# Patient Record
Sex: Male | Born: 1945
Health system: Southern US, Community
[De-identification: ages and names within clinical notes are randomized; demographics above are authoritative.]

## PROBLEM LIST (undated history)

## (undated) DIAGNOSIS — N390 Urinary tract infection, site not specified: Secondary | ICD-10-CM

## (undated) DIAGNOSIS — E785 Hyperlipidemia, unspecified: Secondary | ICD-10-CM

## (undated) DIAGNOSIS — F419 Anxiety disorder, unspecified: Secondary | ICD-10-CM

## (undated) DIAGNOSIS — Z86010 Personal history of colonic polyps: Secondary | ICD-10-CM

## (undated) DIAGNOSIS — Z978 Presence of other specified devices: Secondary | ICD-10-CM

## (undated) DIAGNOSIS — C801 Malignant (primary) neoplasm, unspecified: Secondary | ICD-10-CM

## (undated) DIAGNOSIS — I10 Essential (primary) hypertension: Secondary | ICD-10-CM

## (undated) HISTORY — DX: Hyperlipidemia, unspecified: E78.5

## (undated) HISTORY — DX: Essential (primary) hypertension: I10

## (undated) HISTORY — DX: Personal history of colonic polyps: Z86.010

## (undated) HISTORY — PX: COLONOSCOPY: SHX174

## (undated) HISTORY — PX: EYE SURGERY: SHX253

## (undated) HISTORY — PX: HERNIA REPAIR: SHX51

---

## 2011-08-07 DIAGNOSIS — H52209 Unspecified astigmatism, unspecified eye: Secondary | ICD-10-CM | POA: Diagnosis not present

## 2011-08-07 DIAGNOSIS — H2510 Age-related nuclear cataract, unspecified eye: Secondary | ICD-10-CM | POA: Diagnosis not present

## 2011-08-10 DIAGNOSIS — I10 Essential (primary) hypertension: Secondary | ICD-10-CM | POA: Diagnosis not present

## 2011-08-10 DIAGNOSIS — R7301 Impaired fasting glucose: Secondary | ICD-10-CM | POA: Diagnosis not present

## 2011-08-10 DIAGNOSIS — E785 Hyperlipidemia, unspecified: Secondary | ICD-10-CM | POA: Diagnosis not present

## 2011-11-30 DIAGNOSIS — Z23 Encounter for immunization: Secondary | ICD-10-CM | POA: Diagnosis not present

## 2012-03-08 DIAGNOSIS — R7301 Impaired fasting glucose: Secondary | ICD-10-CM | POA: Diagnosis not present

## 2012-03-08 DIAGNOSIS — E559 Vitamin D deficiency, unspecified: Secondary | ICD-10-CM | POA: Diagnosis not present

## 2012-03-08 DIAGNOSIS — Z125 Encounter for screening for malignant neoplasm of prostate: Secondary | ICD-10-CM | POA: Diagnosis not present

## 2012-03-08 DIAGNOSIS — E785 Hyperlipidemia, unspecified: Secondary | ICD-10-CM | POA: Diagnosis not present

## 2012-03-08 DIAGNOSIS — I10 Essential (primary) hypertension: Secondary | ICD-10-CM | POA: Diagnosis not present

## 2012-03-15 DIAGNOSIS — Z1331 Encounter for screening for depression: Secondary | ICD-10-CM | POA: Diagnosis not present

## 2012-03-15 DIAGNOSIS — Z0000 Encounter for general adult medical examination without abnormal findings: Secondary | ICD-10-CM | POA: Diagnosis not present

## 2012-03-15 DIAGNOSIS — Z125 Encounter for screening for malignant neoplasm of prostate: Secondary | ICD-10-CM | POA: Diagnosis not present

## 2012-03-15 DIAGNOSIS — I10 Essential (primary) hypertension: Secondary | ICD-10-CM | POA: Diagnosis not present

## 2012-03-15 DIAGNOSIS — E785 Hyperlipidemia, unspecified: Secondary | ICD-10-CM | POA: Diagnosis not present

## 2012-03-20 DIAGNOSIS — L821 Other seborrheic keratosis: Secondary | ICD-10-CM | POA: Diagnosis not present

## 2012-03-20 DIAGNOSIS — L723 Sebaceous cyst: Secondary | ICD-10-CM | POA: Diagnosis not present

## 2012-03-20 DIAGNOSIS — D2330 Other benign neoplasm of skin of unspecified part of face: Secondary | ICD-10-CM | POA: Diagnosis not present

## 2012-03-20 DIAGNOSIS — D1801 Hemangioma of skin and subcutaneous tissue: Secondary | ICD-10-CM | POA: Diagnosis not present

## 2012-03-20 DIAGNOSIS — D485 Neoplasm of uncertain behavior of skin: Secondary | ICD-10-CM | POA: Diagnosis not present

## 2012-04-01 DIAGNOSIS — R972 Elevated prostate specific antigen [PSA]: Secondary | ICD-10-CM | POA: Diagnosis not present

## 2012-04-09 DIAGNOSIS — R972 Elevated prostate specific antigen [PSA]: Secondary | ICD-10-CM | POA: Diagnosis not present

## 2012-08-22 ENCOUNTER — Telehealth: Payer: Self-pay

## 2012-08-22 DIAGNOSIS — Z1211 Encounter for screening for malignant neoplasm of colon: Secondary | ICD-10-CM

## 2012-08-22 MED ORDER — MOVIPREP 100 G PO SOLR: 1.0000 | Freq: Once | ORAL | Status: DC

## 2012-08-22 NOTE — Telephone Encounter (Signed)
Kerigan Narvaez, My dad's last colon[place] was in [place] and was 12 years ago, no polyps found. He needs repeat routine screening colonoscopy, he was hoping for sometime in August, I 'm sending him to Dr. Leone Payor who already knows about it. His cell number is [phone]. Name Charles Galloway. Thanks   Pt has been scheduled paperwork left at my desk for Dr Christella Hartigan to pick up and have signed

## 2012-10-21 HISTORY — PX: TOOTH EXTRACTION: SUR596

## 2012-10-30 ENCOUNTER — Encounter: Payer: Self-pay | Admitting: Internal Medicine

## 2012-10-30 ENCOUNTER — Ambulatory Visit (AMBULATORY_SURGERY_CENTER): Payer: Medicare Other | Admitting: Internal Medicine

## 2012-10-30 VITALS — BP 135/81 | HR 51 | Temp 97.4°F | Resp 16 | Ht 69.0 in | Wt 180.0 lb

## 2012-10-30 DIAGNOSIS — Z1211 Encounter for screening for malignant neoplasm of colon: Secondary | ICD-10-CM | POA: Diagnosis not present

## 2012-10-30 DIAGNOSIS — D126 Benign neoplasm of colon, unspecified: Secondary | ICD-10-CM

## 2012-10-30 DIAGNOSIS — K5730 Diverticulosis of large intestine without perforation or abscess without bleeding: Secondary | ICD-10-CM

## 2012-10-30 MED ORDER — SODIUM CHLORIDE 0.9 % IV SOLN: 500.0000 mL | INTRAVENOUS | Status: DC

## 2012-10-30 NOTE — Patient Instructions (Addendum)
I found and removed two small polyps that look benign. You also have diverticulosis of the colon - thickened muscles and pockets that are common but not usually a problem.  I will let you know pathology results and when to have another routine colonoscopy by mail.  I appreciate the opportunity to care for you. Iva Boop, MD, FACG   YOU HAD AN ENDOSCOPIC PROCEDURE TODAY AT THE [facility]: Refer to the procedure report that was given to you for any specific questions about what was found during the examination.  If the procedure report does not answer your questions, please call your gastroenterologist to clarify.  If you requested that your care partner not be given the details of your procedure findings, then the procedure report has been included in a sealed envelope for you to review at your convenience later.  YOU SHOULD EXPECT: Some feelings of bloating in the abdomen. Passage of more gas than usual.  Walking can help get rid of the air that was put into your GI tract during the procedure and reduce the bloating. If you had a lower endoscopy (such as a colonoscopy or flexible sigmoidoscopy) you may notice spotting of blood in your stool or on the toilet paper. If you underwent a bowel prep for your procedure, then you may not have a normal bowel movement for a few days.  DIET: Your first meal following the procedure should be a light meal and then it is ok to progress to your normal diet.  A half-sandwich or bowl of soup is an example of a good first meal.  Heavy or fried foods are harder to digest and may make you feel nauseous or bloated.  Likewise meals heavy in dairy and vegetables can cause extra gas to form and this can also increase the bloating.  Drink plenty of fluids but you should avoid alcoholic beverages for 24 hours.  ACTIVITY: Your care partner should take you home directly after the procedure.  You should plan to take it easy, moving slowly for the rest of the  day.  You can resume normal activity the day after the procedure however you should NOT DRIVE or use heavy machinery for 24 hours (because of the sedation medicines used during the test).    SYMPTOMS TO REPORT IMMEDIATELY: A gastroenterologist can be reached at any hour.  During normal business hours, 8:30 AM to 5:00 PM Monday through Friday, call [phone].  After hours and on weekends, please call the GI answering service at [phone] who will take a message and have the physician on call contact you.   Following lower endoscopy (colonoscopy or flexible sigmoidoscopy):  Excessive amounts of blood in the stool  Significant tenderness or worsening of abdominal pains  Swelling of the abdomen that is new, acute  Fever of 100F or higher  FOLLOW UP: If any biopsies were taken you will be contacted by phone or by letter within the next 1-3 weeks.  Call your gastroenterologist if you have not heard about the biopsies in 3 weeks.  Our staff will call the home number listed on your records the next business day following your procedure to check on you and address any questions or concerns that you may have at that time regarding the information given to you following your procedure. This is a courtesy call and so if there is no answer at the home number and we have not heard from you through the emergency physician on call, we  will assume that you have returned to your regular daily activities without incident.  SIGNATURES/CONFIDENTIALITY: You and/or your care partner have signed paperwork which will be entered into your electronic medical record.  These signatures attest to the fact that that the information above on your After Visit Summary has been reviewed and is understood.  Full responsibility of the confidentiality of this discharge information lies with you and/or your care-partner.

## 2012-10-30 NOTE — Op Note (Signed)
[facility] [address] [place], [address]   COLONOSCOPY PROCEDURE REPORT  PATIENT: Charles Galloway, Charles Galloway  MR#: [id number] BIRTHDATE: 09-28-1945 , 67  yrs. old GENDER: Male ENDOSCOPIST: Iva Boop, MD, [facility] PROCEDURE DATE:  10/30/2012 PROCEDURE:   Colonoscopy with snare polypectomy First Screening Colonoscopy - Avg.  risk and is 50 yrs.  old or older - No.  Prior Negative Screening - Now for repeat screening. 10 or more years since last screening  History of Adenoma - Now for follow-up colonoscopy & has been > or = to 3 yrs.  N/A  Polyps Removed Today? Yes. ASA CLASS:   Class II INDICATIONS:average risk screening and Last colonoscopy performed 10 years ago. MEDICATIONS: propofol (Diprivan) 250mg  IV, MAC sedation, administered by CRNA, and These medications were titrated to patient response per physician's verbal order  DESCRIPTION OF PROCEDURE:   After the risks benefits and alternatives of the procedure were thoroughly explained, informed consent was obtained.  A digital rectal exam revealed no abnormalities of the rectum, A digital rectal exam revealed no prostatic nodules, and A digital rectal exam revealed the prostate was not enlarged.   The [id number]  endoscope was introduced through the anus and advanced to the cecum, which was identified by both the appendix and ileocecal valve. No adverse events experienced.   The quality of the prep was good, using MoviPrep  The instrument was then slowly withdrawn as the colon was fully examined.   COLON FINDINGS: A flat polyp measuring 7 mm in size was found at the cecum.  A polypectomy was performed with a cold snare.  The resection was complete and the polyp tissue was completely retrieved.   A sessile polyp measuring 4 mm in size was found in the rectum.  A polypectomy was performed with a cold snare.  The resection was complete and the polyp tissue was completely retrieved.   Moderate  diverticulosis was noted in the sigmoid colon.   The colon mucosa was otherwise normal.   A right colon retroflexion was performed.  Retroflexed views revealed no abnormalities. The time to cecum=1 minutes 21 seconds.  Withdrawal time=13 minutes 0 seconds.  The scope was withdrawn and the procedure completed. COMPLICATIONS: There were no complications.  ENDOSCOPIC IMPRESSION: 1.   Flat polyp measuring 7 mm in size was found at the cecum; polypectomy was performed with a cold snare 2.   Sessile polyp measuring 4 mm in size was found in the rectum; polypectomy was performed with a cold snare 3.   Moderate diverticulosis was noted in the sigmoid colon 4.   The colon mucosa was otherwise normal - good prep second colonoscopy  RECOMMENDATIONS: Timing of repeat colonoscopy will be determined by pathology findings.  eSigned:  Iva Boop, MD, [facility] 10/30/2012 12:15 PM cc: Kari Baars, MD and The Patient

## 2012-10-30 NOTE — Progress Notes (Signed)
Patient did not experience any of the following events: a burn prior to discharge; a fall within the facility; wrong site/side/patient/procedure/implant event; or a hospital transfer or hospital admission upon discharge from the facility. (G8907) Patient did not have preoperative order for IV antibiotic SSI prophylaxis. (G8918)  

## 2012-10-30 NOTE — Progress Notes (Signed)
Lidocaine-40mg IV prior to Propofol InductionPropofol given over incremental dosages 

## 2012-10-30 NOTE — Progress Notes (Signed)
Called to room to assist during endoscopic procedure.  Patient ID and intended procedure confirmed with present staff. Received instructions for my participation in the procedure from the performing physician.  

## 2012-10-31 ENCOUNTER — Telehealth: Payer: Self-pay

## 2012-10-31 NOTE — Telephone Encounter (Signed)
  Follow up Call-  Call back number 10/30/2012  Post procedure Call Back phone  # [phone]  Permission to leave phone message Yes     Patient questions:  Do you have a fever, pain , or abdominal swelling? no Pain Score  0 *  Have you tolerated food without any problems? yes  Have you been able to return to your normal activities? yes  Do you have any questions about your discharge instructions: Diet   no Medications  no Follow up visit  no  Do you have questions or concerns about your Care? no  Actions: * If pain score is 4 or above: No action needed, pain <4.

## 2012-11-05 ENCOUNTER — Encounter: Payer: Self-pay | Admitting: Internal Medicine

## 2012-11-05 DIAGNOSIS — Z860100 Personal history of colon polyps, unspecified: Secondary | ICD-10-CM

## 2012-11-05 HISTORY — DX: Personal history of colonic polyps: Z86.010

## 2012-11-05 HISTORY — DX: Personal history of colon polyps, unspecified: Z86.0100

## 2012-11-05 NOTE — Progress Notes (Signed)
Quick Note:  7 mm sessile serrated polyp and a 4 mm hyperplastic polyp Repeat colonoscopy 2019 ______

## 2012-11-07 DIAGNOSIS — Z23 Encounter for immunization: Secondary | ICD-10-CM | POA: Diagnosis not present

## 2013-02-10 DIAGNOSIS — H52209 Unspecified astigmatism, unspecified eye: Secondary | ICD-10-CM | POA: Diagnosis not present

## 2013-02-10 DIAGNOSIS — H2510 Age-related nuclear cataract, unspecified eye: Secondary | ICD-10-CM | POA: Diagnosis not present

## 2013-03-14 DIAGNOSIS — Z125 Encounter for screening for malignant neoplasm of prostate: Secondary | ICD-10-CM | POA: Diagnosis not present

## 2013-03-14 DIAGNOSIS — E559 Vitamin D deficiency, unspecified: Secondary | ICD-10-CM | POA: Diagnosis not present

## 2013-03-14 DIAGNOSIS — R7301 Impaired fasting glucose: Secondary | ICD-10-CM | POA: Diagnosis not present

## 2013-03-14 DIAGNOSIS — E785 Hyperlipidemia, unspecified: Secondary | ICD-10-CM | POA: Diagnosis not present

## 2013-03-14 DIAGNOSIS — I10 Essential (primary) hypertension: Secondary | ICD-10-CM | POA: Diagnosis not present

## 2013-03-21 DIAGNOSIS — I10 Essential (primary) hypertension: Secondary | ICD-10-CM | POA: Diagnosis not present

## 2013-03-21 DIAGNOSIS — Z87891 Personal history of nicotine dependence: Secondary | ICD-10-CM | POA: Diagnosis not present

## 2013-03-21 DIAGNOSIS — F3289 Other specified depressive episodes: Secondary | ICD-10-CM | POA: Diagnosis not present

## 2013-03-21 DIAGNOSIS — E785 Hyperlipidemia, unspecified: Secondary | ICD-10-CM | POA: Diagnosis not present

## 2013-03-21 DIAGNOSIS — Z0000 Encounter for general adult medical examination without abnormal findings: Secondary | ICD-10-CM | POA: Diagnosis not present

## 2013-03-21 DIAGNOSIS — Z23 Encounter for immunization: Secondary | ICD-10-CM | POA: Diagnosis not present

## 2013-03-21 DIAGNOSIS — Z1331 Encounter for screening for depression: Secondary | ICD-10-CM | POA: Diagnosis not present

## 2013-03-21 DIAGNOSIS — R7301 Impaired fasting glucose: Secondary | ICD-10-CM | POA: Diagnosis not present

## 2013-03-21 DIAGNOSIS — Z125 Encounter for screening for malignant neoplasm of prostate: Secondary | ICD-10-CM | POA: Diagnosis not present

## 2013-03-21 DIAGNOSIS — E559 Vitamin D deficiency, unspecified: Secondary | ICD-10-CM | POA: Diagnosis not present

## 2013-10-16 DIAGNOSIS — R972 Elevated prostate specific antigen [PSA]: Secondary | ICD-10-CM | POA: Diagnosis not present

## 2013-10-16 DIAGNOSIS — Z125 Encounter for screening for malignant neoplasm of prostate: Secondary | ICD-10-CM | POA: Diagnosis not present

## 2013-10-30 DIAGNOSIS — Z23 Encounter for immunization: Secondary | ICD-10-CM | POA: Diagnosis not present

## 2014-03-19 DIAGNOSIS — E559 Vitamin D deficiency, unspecified: Secondary | ICD-10-CM | POA: Diagnosis not present

## 2014-03-19 DIAGNOSIS — R7301 Impaired fasting glucose: Secondary | ICD-10-CM | POA: Diagnosis not present

## 2014-03-19 DIAGNOSIS — Z125 Encounter for screening for malignant neoplasm of prostate: Secondary | ICD-10-CM | POA: Diagnosis not present

## 2014-03-19 DIAGNOSIS — E785 Hyperlipidemia, unspecified: Secondary | ICD-10-CM | POA: Diagnosis not present

## 2014-03-19 DIAGNOSIS — I10 Essential (primary) hypertension: Secondary | ICD-10-CM | POA: Diagnosis not present

## 2014-03-19 DIAGNOSIS — Z0000 Encounter for general adult medical examination without abnormal findings: Secondary | ICD-10-CM | POA: Diagnosis not present

## 2014-03-26 DIAGNOSIS — Z6826 Body mass index (BMI) 26.0-26.9, adult: Secondary | ICD-10-CM | POA: Diagnosis not present

## 2014-03-26 DIAGNOSIS — I10 Essential (primary) hypertension: Secondary | ICD-10-CM | POA: Diagnosis not present

## 2014-03-26 DIAGNOSIS — F329 Major depressive disorder, single episode, unspecified: Secondary | ICD-10-CM | POA: Diagnosis not present

## 2014-03-26 DIAGNOSIS — Z0000 Encounter for general adult medical examination without abnormal findings: Secondary | ICD-10-CM | POA: Diagnosis not present

## 2014-03-26 DIAGNOSIS — E559 Vitamin D deficiency, unspecified: Secondary | ICD-10-CM | POA: Diagnosis not present

## 2014-03-26 DIAGNOSIS — Z1389 Encounter for screening for other disorder: Secondary | ICD-10-CM | POA: Diagnosis not present

## 2014-03-26 DIAGNOSIS — E785 Hyperlipidemia, unspecified: Secondary | ICD-10-CM | POA: Diagnosis not present

## 2014-03-26 DIAGNOSIS — R972 Elevated prostate specific antigen [PSA]: Secondary | ICD-10-CM | POA: Diagnosis not present

## 2014-03-26 DIAGNOSIS — R7301 Impaired fasting glucose: Secondary | ICD-10-CM | POA: Diagnosis not present

## 2014-03-26 DIAGNOSIS — Z125 Encounter for screening for malignant neoplasm of prostate: Secondary | ICD-10-CM | POA: Diagnosis not present

## 2014-10-08 DIAGNOSIS — M79602 Pain in left arm: Secondary | ICD-10-CM | POA: Diagnosis not present

## 2014-10-08 DIAGNOSIS — Z6825 Body mass index (BMI) 25.0-25.9, adult: Secondary | ICD-10-CM | POA: Diagnosis not present

## 2014-10-08 DIAGNOSIS — I10 Essential (primary) hypertension: Secondary | ICD-10-CM | POA: Diagnosis not present

## 2014-10-08 DIAGNOSIS — E785 Hyperlipidemia, unspecified: Secondary | ICD-10-CM | POA: Diagnosis not present

## 2014-10-20 ENCOUNTER — Ambulatory Visit (INDEPENDENT_AMBULATORY_CARE_PROVIDER_SITE_OTHER): Payer: Medicare Other | Admitting: Psychology

## 2014-10-20 DIAGNOSIS — F411 Generalized anxiety disorder: Secondary | ICD-10-CM

## 2014-10-24 DIAGNOSIS — N410 Acute prostatitis: Secondary | ICD-10-CM | POA: Diagnosis not present

## 2014-10-25 DIAGNOSIS — N410 Acute prostatitis: Secondary | ICD-10-CM | POA: Diagnosis not present

## 2014-10-30 ENCOUNTER — Ambulatory Visit (INDEPENDENT_AMBULATORY_CARE_PROVIDER_SITE_OTHER): Payer: Medicare Other | Admitting: Psychology

## 2014-10-30 DIAGNOSIS — F411 Generalized anxiety disorder: Secondary | ICD-10-CM | POA: Diagnosis not present

## 2014-11-02 DIAGNOSIS — H2513 Age-related nuclear cataract, bilateral: Secondary | ICD-10-CM | POA: Diagnosis not present

## 2014-11-02 DIAGNOSIS — R3915 Urgency of urination: Secondary | ICD-10-CM | POA: Diagnosis not present

## 2014-11-02 DIAGNOSIS — H52203 Unspecified astigmatism, bilateral: Secondary | ICD-10-CM | POA: Diagnosis not present

## 2014-11-02 DIAGNOSIS — R972 Elevated prostate specific antigen [PSA]: Secondary | ICD-10-CM | POA: Diagnosis not present

## 2014-11-02 DIAGNOSIS — N400 Enlarged prostate without lower urinary tract symptoms: Secondary | ICD-10-CM | POA: Diagnosis not present

## 2014-11-16 ENCOUNTER — Ambulatory Visit (INDEPENDENT_AMBULATORY_CARE_PROVIDER_SITE_OTHER): Payer: Medicare Other | Admitting: Psychology

## 2014-11-16 DIAGNOSIS — F411 Generalized anxiety disorder: Secondary | ICD-10-CM | POA: Diagnosis not present

## 2014-11-25 ENCOUNTER — Ambulatory Visit (INDEPENDENT_AMBULATORY_CARE_PROVIDER_SITE_OTHER): Payer: Medicare Other | Admitting: Psychology

## 2014-11-25 DIAGNOSIS — F411 Generalized anxiety disorder: Secondary | ICD-10-CM

## 2014-12-05 DIAGNOSIS — Z23 Encounter for immunization: Secondary | ICD-10-CM | POA: Diagnosis not present

## 2014-12-16 ENCOUNTER — Ambulatory Visit (INDEPENDENT_AMBULATORY_CARE_PROVIDER_SITE_OTHER): Payer: Medicare Other | Admitting: Psychology

## 2014-12-16 DIAGNOSIS — F411 Generalized anxiety disorder: Secondary | ICD-10-CM

## 2015-03-11 DIAGNOSIS — M25812 Other specified joint disorders, left shoulder: Secondary | ICD-10-CM | POA: Diagnosis not present

## 2015-03-11 DIAGNOSIS — H2512 Age-related nuclear cataract, left eye: Secondary | ICD-10-CM | POA: Diagnosis not present

## 2015-03-11 DIAGNOSIS — H52202 Unspecified astigmatism, left eye: Secondary | ICD-10-CM | POA: Diagnosis not present

## 2015-04-07 DIAGNOSIS — R7301 Impaired fasting glucose: Secondary | ICD-10-CM | POA: Diagnosis not present

## 2015-04-07 DIAGNOSIS — I10 Essential (primary) hypertension: Secondary | ICD-10-CM | POA: Diagnosis not present

## 2015-04-07 DIAGNOSIS — E784 Other hyperlipidemia: Secondary | ICD-10-CM | POA: Diagnosis not present

## 2015-04-07 DIAGNOSIS — E559 Vitamin D deficiency, unspecified: Secondary | ICD-10-CM | POA: Diagnosis not present

## 2015-04-07 DIAGNOSIS — Z125 Encounter for screening for malignant neoplasm of prostate: Secondary | ICD-10-CM | POA: Diagnosis not present

## 2015-04-13 DIAGNOSIS — H35372 Puckering of macula, left eye: Secondary | ICD-10-CM | POA: Diagnosis not present

## 2015-04-13 DIAGNOSIS — H43812 Vitreous degeneration, left eye: Secondary | ICD-10-CM | POA: Diagnosis not present

## 2015-04-14 DIAGNOSIS — I10 Essential (primary) hypertension: Secondary | ICD-10-CM | POA: Diagnosis not present

## 2015-04-14 DIAGNOSIS — R7301 Impaired fasting glucose: Secondary | ICD-10-CM | POA: Diagnosis not present

## 2015-04-14 DIAGNOSIS — N400 Enlarged prostate without lower urinary tract symptoms: Secondary | ICD-10-CM | POA: Diagnosis not present

## 2015-04-14 DIAGNOSIS — F329 Major depressive disorder, single episode, unspecified: Secondary | ICD-10-CM | POA: Diagnosis not present

## 2015-04-14 DIAGNOSIS — Z0000 Encounter for general adult medical examination without abnormal findings: Secondary | ICD-10-CM | POA: Diagnosis not present

## 2015-04-14 DIAGNOSIS — Z1389 Encounter for screening for other disorder: Secondary | ICD-10-CM | POA: Diagnosis not present

## 2015-04-14 DIAGNOSIS — R972 Elevated prostate specific antigen [PSA]: Secondary | ICD-10-CM | POA: Diagnosis not present

## 2015-04-14 DIAGNOSIS — E559 Vitamin D deficiency, unspecified: Secondary | ICD-10-CM | POA: Diagnosis not present

## 2015-04-14 DIAGNOSIS — R740 Nonspecific elevation of levels of transaminase and lactic acid dehydrogenase [LDH]: Secondary | ICD-10-CM | POA: Diagnosis not present

## 2015-04-14 DIAGNOSIS — E784 Other hyperlipidemia: Secondary | ICD-10-CM | POA: Diagnosis not present

## 2015-04-14 DIAGNOSIS — Z6827 Body mass index (BMI) 27.0-27.9, adult: Secondary | ICD-10-CM | POA: Diagnosis not present

## 2015-06-15 DIAGNOSIS — H43812 Vitreous degeneration, left eye: Secondary | ICD-10-CM | POA: Diagnosis not present

## 2015-06-15 DIAGNOSIS — H35413 Lattice degeneration of retina, bilateral: Secondary | ICD-10-CM | POA: Diagnosis not present

## 2015-06-15 DIAGNOSIS — H35372 Puckering of macula, left eye: Secondary | ICD-10-CM | POA: Diagnosis not present

## 2015-06-15 DIAGNOSIS — H3581 Retinal edema: Secondary | ICD-10-CM | POA: Diagnosis not present

## 2015-07-26 DIAGNOSIS — H35372 Puckering of macula, left eye: Secondary | ICD-10-CM | POA: Diagnosis not present

## 2015-07-26 DIAGNOSIS — H3581 Retinal edema: Secondary | ICD-10-CM | POA: Diagnosis not present

## 2015-07-26 DIAGNOSIS — H43812 Vitreous degeneration, left eye: Secondary | ICD-10-CM | POA: Diagnosis not present

## 2015-07-26 DIAGNOSIS — H35413 Lattice degeneration of retina, bilateral: Secondary | ICD-10-CM | POA: Diagnosis not present

## 2015-08-26 DIAGNOSIS — H35412 Lattice degeneration of retina, left eye: Secondary | ICD-10-CM | POA: Diagnosis not present

## 2015-08-26 DIAGNOSIS — H3581 Retinal edema: Secondary | ICD-10-CM | POA: Diagnosis not present

## 2015-08-26 DIAGNOSIS — H35372 Puckering of macula, left eye: Secondary | ICD-10-CM | POA: Diagnosis not present

## 2015-09-03 DIAGNOSIS — H3581 Retinal edema: Secondary | ICD-10-CM | POA: Diagnosis not present

## 2015-09-03 DIAGNOSIS — Z48810 Encounter for surgical aftercare following surgery on the sense organs: Secondary | ICD-10-CM | POA: Diagnosis not present

## 2015-09-03 DIAGNOSIS — H35372 Puckering of macula, left eye: Secondary | ICD-10-CM | POA: Diagnosis not present

## 2015-09-22 DIAGNOSIS — H35372 Puckering of macula, left eye: Secondary | ICD-10-CM | POA: Diagnosis not present

## 2015-09-22 DIAGNOSIS — H3581 Retinal edema: Secondary | ICD-10-CM | POA: Diagnosis not present

## 2015-11-12 DIAGNOSIS — Z23 Encounter for immunization: Secondary | ICD-10-CM | POA: Diagnosis not present

## 2015-11-17 DIAGNOSIS — H3581 Retinal edema: Secondary | ICD-10-CM | POA: Diagnosis not present

## 2015-11-17 DIAGNOSIS — H35372 Puckering of macula, left eye: Secondary | ICD-10-CM | POA: Diagnosis not present

## 2016-02-23 DIAGNOSIS — H43811 Vitreous degeneration, right eye: Secondary | ICD-10-CM | POA: Diagnosis not present

## 2016-02-23 DIAGNOSIS — H35372 Puckering of macula, left eye: Secondary | ICD-10-CM | POA: Diagnosis not present

## 2016-02-25 DIAGNOSIS — H5203 Hypermetropia, bilateral: Secondary | ICD-10-CM | POA: Diagnosis not present

## 2016-02-25 DIAGNOSIS — H2511 Age-related nuclear cataract, right eye: Secondary | ICD-10-CM | POA: Diagnosis not present

## 2016-03-30 DIAGNOSIS — H2511 Age-related nuclear cataract, right eye: Secondary | ICD-10-CM | POA: Diagnosis not present

## 2016-03-30 DIAGNOSIS — H25811 Combined forms of age-related cataract, right eye: Secondary | ICD-10-CM | POA: Diagnosis not present

## 2016-03-30 DIAGNOSIS — H21561 Pupillary abnormality, right eye: Secondary | ICD-10-CM | POA: Diagnosis not present

## 2016-04-12 DIAGNOSIS — Z125 Encounter for screening for malignant neoplasm of prostate: Secondary | ICD-10-CM | POA: Diagnosis not present

## 2016-04-12 DIAGNOSIS — E784 Other hyperlipidemia: Secondary | ICD-10-CM | POA: Diagnosis not present

## 2016-04-12 DIAGNOSIS — R7301 Impaired fasting glucose: Secondary | ICD-10-CM | POA: Diagnosis not present

## 2016-04-12 DIAGNOSIS — I10 Essential (primary) hypertension: Secondary | ICD-10-CM | POA: Diagnosis not present

## 2016-04-12 DIAGNOSIS — E559 Vitamin D deficiency, unspecified: Secondary | ICD-10-CM | POA: Diagnosis not present

## 2016-04-19 DIAGNOSIS — Z6828 Body mass index (BMI) 28.0-28.9, adult: Secondary | ICD-10-CM | POA: Diagnosis not present

## 2016-04-19 DIAGNOSIS — Z1389 Encounter for screening for other disorder: Secondary | ICD-10-CM | POA: Diagnosis not present

## 2016-04-19 DIAGNOSIS — F3289 Other specified depressive episodes: Secondary | ICD-10-CM | POA: Diagnosis not present

## 2016-04-19 DIAGNOSIS — R972 Elevated prostate specific antigen [PSA]: Secondary | ICD-10-CM | POA: Diagnosis not present

## 2016-04-19 DIAGNOSIS — E784 Other hyperlipidemia: Secondary | ICD-10-CM | POA: Diagnosis not present

## 2016-04-19 DIAGNOSIS — I10 Essential (primary) hypertension: Secondary | ICD-10-CM | POA: Diagnosis not present

## 2016-04-19 DIAGNOSIS — Z0000 Encounter for general adult medical examination without abnormal findings: Secondary | ICD-10-CM | POA: Diagnosis not present

## 2016-04-19 DIAGNOSIS — E559 Vitamin D deficiency, unspecified: Secondary | ICD-10-CM | POA: Diagnosis not present

## 2016-04-19 DIAGNOSIS — R740 Nonspecific elevation of levels of transaminase and lactic acid dehydrogenase [LDH]: Secondary | ICD-10-CM | POA: Diagnosis not present

## 2016-04-19 DIAGNOSIS — R7301 Impaired fasting glucose: Secondary | ICD-10-CM | POA: Diagnosis not present

## 2016-04-19 DIAGNOSIS — N400 Enlarged prostate without lower urinary tract symptoms: Secondary | ICD-10-CM | POA: Diagnosis not present

## 2016-11-28 DIAGNOSIS — Z23 Encounter for immunization: Secondary | ICD-10-CM | POA: Diagnosis not present

## 2017-04-19 DIAGNOSIS — R7301 Impaired fasting glucose: Secondary | ICD-10-CM | POA: Diagnosis not present

## 2017-04-19 DIAGNOSIS — E7849 Other hyperlipidemia: Secondary | ICD-10-CM | POA: Diagnosis not present

## 2017-04-19 DIAGNOSIS — Z125 Encounter for screening for malignant neoplasm of prostate: Secondary | ICD-10-CM | POA: Diagnosis not present

## 2017-04-19 DIAGNOSIS — E559 Vitamin D deficiency, unspecified: Secondary | ICD-10-CM | POA: Diagnosis not present

## 2017-04-19 DIAGNOSIS — I10 Essential (primary) hypertension: Secondary | ICD-10-CM | POA: Diagnosis not present

## 2017-04-26 DIAGNOSIS — Z6828 Body mass index (BMI) 28.0-28.9, adult: Secondary | ICD-10-CM | POA: Diagnosis not present

## 2017-04-26 DIAGNOSIS — Z0000 Encounter for general adult medical examination without abnormal findings: Secondary | ICD-10-CM | POA: Diagnosis not present

## 2017-04-26 DIAGNOSIS — R7301 Impaired fasting glucose: Secondary | ICD-10-CM | POA: Diagnosis not present

## 2017-04-26 DIAGNOSIS — I10 Essential (primary) hypertension: Secondary | ICD-10-CM | POA: Diagnosis not present

## 2017-04-26 DIAGNOSIS — R740 Nonspecific elevation of levels of transaminase and lactic acid dehydrogenase [LDH]: Secondary | ICD-10-CM | POA: Diagnosis not present

## 2017-04-26 DIAGNOSIS — E7849 Other hyperlipidemia: Secondary | ICD-10-CM | POA: Diagnosis not present

## 2017-04-26 DIAGNOSIS — N400 Enlarged prostate without lower urinary tract symptoms: Secondary | ICD-10-CM | POA: Diagnosis not present

## 2017-04-26 DIAGNOSIS — E559 Vitamin D deficiency, unspecified: Secondary | ICD-10-CM | POA: Diagnosis not present

## 2017-04-26 DIAGNOSIS — F3289 Other specified depressive episodes: Secondary | ICD-10-CM | POA: Diagnosis not present

## 2017-04-26 DIAGNOSIS — R972 Elevated prostate specific antigen [PSA]: Secondary | ICD-10-CM | POA: Diagnosis not present

## 2017-04-26 DIAGNOSIS — Z1389 Encounter for screening for other disorder: Secondary | ICD-10-CM | POA: Diagnosis not present

## 2017-04-26 DIAGNOSIS — F17201 Nicotine dependence, unspecified, in remission: Secondary | ICD-10-CM | POA: Diagnosis not present

## 2017-05-15 DIAGNOSIS — Z961 Presence of intraocular lens: Secondary | ICD-10-CM | POA: Diagnosis not present

## 2017-11-14 DIAGNOSIS — Z23 Encounter for immunization: Secondary | ICD-10-CM | POA: Diagnosis not present

## 2017-11-20 DIAGNOSIS — R3915 Urgency of urination: Secondary | ICD-10-CM | POA: Diagnosis not present

## 2017-11-20 DIAGNOSIS — R972 Elevated prostate specific antigen [PSA]: Secondary | ICD-10-CM | POA: Diagnosis not present

## 2018-02-25 ENCOUNTER — Encounter: Payer: Self-pay | Admitting: Internal Medicine

## 2018-03-15 DIAGNOSIS — R972 Elevated prostate specific antigen [PSA]: Secondary | ICD-10-CM | POA: Diagnosis not present

## 2018-03-21 DIAGNOSIS — R972 Elevated prostate specific antigen [PSA]: Secondary | ICD-10-CM | POA: Diagnosis not present

## 2018-03-21 DIAGNOSIS — R3915 Urgency of urination: Secondary | ICD-10-CM | POA: Diagnosis not present

## 2018-04-26 DIAGNOSIS — E559 Vitamin D deficiency, unspecified: Secondary | ICD-10-CM | POA: Diagnosis not present

## 2018-04-26 DIAGNOSIS — R7301 Impaired fasting glucose: Secondary | ICD-10-CM | POA: Diagnosis not present

## 2018-04-26 DIAGNOSIS — E7849 Other hyperlipidemia: Secondary | ICD-10-CM | POA: Diagnosis not present

## 2018-04-26 DIAGNOSIS — I10 Essential (primary) hypertension: Secondary | ICD-10-CM | POA: Diagnosis not present

## 2018-04-26 DIAGNOSIS — Z125 Encounter for screening for malignant neoplasm of prostate: Secondary | ICD-10-CM | POA: Diagnosis not present

## 2018-05-13 DIAGNOSIS — R972 Elevated prostate specific antigen [PSA]: Secondary | ICD-10-CM | POA: Diagnosis not present

## 2018-05-13 DIAGNOSIS — E559 Vitamin D deficiency, unspecified: Secondary | ICD-10-CM | POA: Diagnosis not present

## 2018-05-13 DIAGNOSIS — R7301 Impaired fasting glucose: Secondary | ICD-10-CM | POA: Diagnosis not present

## 2018-05-13 DIAGNOSIS — Z1331 Encounter for screening for depression: Secondary | ICD-10-CM | POA: Diagnosis not present

## 2018-05-13 DIAGNOSIS — N400 Enlarged prostate without lower urinary tract symptoms: Secondary | ICD-10-CM | POA: Diagnosis not present

## 2018-05-13 DIAGNOSIS — E785 Hyperlipidemia, unspecified: Secondary | ICD-10-CM | POA: Diagnosis not present

## 2018-05-13 DIAGNOSIS — Z1339 Encounter for screening examination for other mental health and behavioral disorders: Secondary | ICD-10-CM | POA: Diagnosis not present

## 2018-05-13 DIAGNOSIS — Z0000 Encounter for general adult medical examination without abnormal findings: Secondary | ICD-10-CM | POA: Diagnosis not present

## 2018-05-13 DIAGNOSIS — I10 Essential (primary) hypertension: Secondary | ICD-10-CM | POA: Diagnosis not present

## 2018-05-13 DIAGNOSIS — R740 Nonspecific elevation of levels of transaminase and lactic acid dehydrogenase [LDH]: Secondary | ICD-10-CM | POA: Diagnosis not present

## 2018-10-30 DIAGNOSIS — Z23 Encounter for immunization: Secondary | ICD-10-CM | POA: Diagnosis not present

## 2019-03-22 ENCOUNTER — Ambulatory Visit: Payer: Medicare Other

## 2019-03-27 ENCOUNTER — Ambulatory Visit: Payer: Medicare Other

## 2019-06-19 DIAGNOSIS — E7849 Other hyperlipidemia: Secondary | ICD-10-CM | POA: Diagnosis not present

## 2019-06-19 DIAGNOSIS — Z125 Encounter for screening for malignant neoplasm of prostate: Secondary | ICD-10-CM | POA: Diagnosis not present

## 2019-06-19 DIAGNOSIS — E559 Vitamin D deficiency, unspecified: Secondary | ICD-10-CM | POA: Diagnosis not present

## 2019-06-19 DIAGNOSIS — R7301 Impaired fasting glucose: Secondary | ICD-10-CM | POA: Diagnosis not present

## 2019-06-26 DIAGNOSIS — Z1331 Encounter for screening for depression: Secondary | ICD-10-CM | POA: Diagnosis not present

## 2019-06-26 DIAGNOSIS — E559 Vitamin D deficiency, unspecified: Secondary | ICD-10-CM | POA: Diagnosis not present

## 2019-06-26 DIAGNOSIS — Z0000 Encounter for general adult medical examination without abnormal findings: Secondary | ICD-10-CM | POA: Diagnosis not present

## 2019-06-26 DIAGNOSIS — R972 Elevated prostate specific antigen [PSA]: Secondary | ICD-10-CM | POA: Diagnosis not present

## 2019-06-26 DIAGNOSIS — F329 Major depressive disorder, single episode, unspecified: Secondary | ICD-10-CM | POA: Diagnosis not present

## 2019-06-26 DIAGNOSIS — E785 Hyperlipidemia, unspecified: Secondary | ICD-10-CM | POA: Diagnosis not present

## 2019-06-26 DIAGNOSIS — K635 Polyp of colon: Secondary | ICD-10-CM | POA: Diagnosis not present

## 2019-06-26 DIAGNOSIS — R7301 Impaired fasting glucose: Secondary | ICD-10-CM | POA: Diagnosis not present

## 2019-06-26 DIAGNOSIS — N400 Enlarged prostate without lower urinary tract symptoms: Secondary | ICD-10-CM | POA: Diagnosis not present

## 2019-06-26 DIAGNOSIS — I10 Essential (primary) hypertension: Secondary | ICD-10-CM | POA: Diagnosis not present

## 2019-10-28 DIAGNOSIS — Z961 Presence of intraocular lens: Secondary | ICD-10-CM | POA: Diagnosis not present

## 2019-10-28 DIAGNOSIS — H524 Presbyopia: Secondary | ICD-10-CM | POA: Diagnosis not present

## 2019-10-28 DIAGNOSIS — H35372 Puckering of macula, left eye: Secondary | ICD-10-CM | POA: Diagnosis not present

## 2019-11-03 DIAGNOSIS — Z23 Encounter for immunization: Secondary | ICD-10-CM | POA: Diagnosis not present

## 2019-11-17 DIAGNOSIS — Z23 Encounter for immunization: Secondary | ICD-10-CM | POA: Diagnosis not present

## 2020-05-03 DIAGNOSIS — R8290 Unspecified abnormal findings in urine: Secondary | ICD-10-CM | POA: Diagnosis not present

## 2020-05-03 DIAGNOSIS — R350 Frequency of micturition: Secondary | ICD-10-CM | POA: Diagnosis not present

## 2020-05-03 DIAGNOSIS — R3915 Urgency of urination: Secondary | ICD-10-CM | POA: Diagnosis not present

## 2020-05-26 DIAGNOSIS — Z23 Encounter for immunization: Secondary | ICD-10-CM | POA: Diagnosis not present

## 2020-06-07 DIAGNOSIS — R310 Gross hematuria: Secondary | ICD-10-CM | POA: Diagnosis not present

## 2020-06-07 DIAGNOSIS — R338 Other retention of urine: Secondary | ICD-10-CM | POA: Diagnosis not present

## 2020-06-07 DIAGNOSIS — R3915 Urgency of urination: Secondary | ICD-10-CM | POA: Diagnosis not present

## 2020-06-07 DIAGNOSIS — R972 Elevated prostate specific antigen [PSA]: Secondary | ICD-10-CM | POA: Diagnosis not present

## 2020-06-07 DIAGNOSIS — N3000 Acute cystitis without hematuria: Secondary | ICD-10-CM | POA: Diagnosis not present

## 2020-06-09 DIAGNOSIS — K4020 Bilateral inguinal hernia, without obstruction or gangrene, not specified as recurrent: Secondary | ICD-10-CM | POA: Diagnosis not present

## 2020-06-09 DIAGNOSIS — N1330 Unspecified hydronephrosis: Secondary | ICD-10-CM | POA: Diagnosis not present

## 2020-06-09 DIAGNOSIS — R310 Gross hematuria: Secondary | ICD-10-CM | POA: Diagnosis not present

## 2020-06-09 DIAGNOSIS — N400 Enlarged prostate without lower urinary tract symptoms: Secondary | ICD-10-CM | POA: Diagnosis not present

## 2020-06-09 DIAGNOSIS — E278 Other specified disorders of adrenal gland: Secondary | ICD-10-CM | POA: Diagnosis not present

## 2020-06-10 DIAGNOSIS — N170 Acute kidney failure with tubular necrosis: Secondary | ICD-10-CM | POA: Diagnosis not present

## 2020-06-10 DIAGNOSIS — R972 Elevated prostate specific antigen [PSA]: Secondary | ICD-10-CM | POA: Diagnosis not present

## 2020-06-10 DIAGNOSIS — R338 Other retention of urine: Secondary | ICD-10-CM | POA: Diagnosis not present

## 2020-06-14 DIAGNOSIS — R972 Elevated prostate specific antigen [PSA]: Secondary | ICD-10-CM | POA: Diagnosis not present

## 2020-06-14 DIAGNOSIS — R310 Gross hematuria: Secondary | ICD-10-CM | POA: Diagnosis not present

## 2020-06-14 DIAGNOSIS — E785 Hyperlipidemia, unspecified: Secondary | ICD-10-CM | POA: Diagnosis not present

## 2020-06-14 DIAGNOSIS — N400 Enlarged prostate without lower urinary tract symptoms: Secondary | ICD-10-CM | POA: Diagnosis not present

## 2020-06-14 DIAGNOSIS — N179 Acute kidney failure, unspecified: Secondary | ICD-10-CM | POA: Diagnosis not present

## 2020-06-14 DIAGNOSIS — I10 Essential (primary) hypertension: Secondary | ICD-10-CM | POA: Diagnosis not present

## 2020-06-14 DIAGNOSIS — N1330 Unspecified hydronephrosis: Secondary | ICD-10-CM | POA: Diagnosis not present

## 2020-06-17 DIAGNOSIS — C61 Malignant neoplasm of prostate: Secondary | ICD-10-CM | POA: Diagnosis not present

## 2020-06-17 DIAGNOSIS — R972 Elevated prostate specific antigen [PSA]: Secondary | ICD-10-CM | POA: Diagnosis not present

## 2020-06-29 DIAGNOSIS — R338 Other retention of urine: Secondary | ICD-10-CM | POA: Diagnosis not present

## 2020-06-29 DIAGNOSIS — C61 Malignant neoplasm of prostate: Secondary | ICD-10-CM | POA: Diagnosis not present

## 2020-06-30 ENCOUNTER — Other Ambulatory Visit: Payer: Self-pay | Admitting: Urology

## 2020-07-05 DIAGNOSIS — M6289 Other specified disorders of muscle: Secondary | ICD-10-CM | POA: Diagnosis not present

## 2020-07-05 DIAGNOSIS — M62838 Other muscle spasm: Secondary | ICD-10-CM | POA: Diagnosis not present

## 2020-07-05 DIAGNOSIS — M6281 Muscle weakness (generalized): Secondary | ICD-10-CM | POA: Diagnosis not present

## 2020-07-05 DIAGNOSIS — C61 Malignant neoplasm of prostate: Secondary | ICD-10-CM | POA: Diagnosis not present

## 2020-07-05 NOTE — Progress Notes (Addendum)
PCP - Marton Redwood , MD Cardiologist - no  PPM/ICD -  Device Orders -  Rep Notified -   Chest x-ray -  EKG -  Stress Test -  ECHO -  Cardiac Cath -   Sleep Study -  CPAP -   Fasting Blood Sugar -  Checks Blood Sugar _____ times a day  Blood Thinner Instructions: Aspirin Instructions: 81 mg  5-7 days hold  ERAS Protcol - PRE-SURGERY Ensure or G2-   COVID TEST- 5-25 Fully vaccinated Moderna Activity---Able to climb a flight of stairs without SOB  Anesthesia review: HTN, Pt. son is Dr. Carole Civil 1.99  Foley cath in place  Patient denies shortness of breath, fever, cough and chest pain at PAT appointment   All instructions explained to the patient, with a verbal understanding of the material. Patient agrees to go over the instructions while at home for a better understanding. Patient also instructed to self quarantine after being tested for COVID-19. The opportunity to ask questions was provided.

## 2020-07-05 NOTE — Patient Instructions (Addendum)
DUE TO COVID-19 ONLY ONE VISITOR IS ALLOWED TO COME WITH YOU AND STAY IN THE WAITING ROOM ONLY DURING PRE OP AND PROCEDURE DAY OF SURGERY.   TWO VISITOR  MAY VISIT WITH YOU AFTER SURGERY IN YOUR PRIVATE ROOM DURING VISITING HOURS ONLY!    YOU NEED TO HAVE A COVID 19 TEST ON__5-25-22_____ @_______ , THIS TEST MUST BE DONE BEFORE SURGERY,    COVID TESTING SITE [address] [place] [address],   IT IS ON THE RIGHT GOING OUT [address] APPROXIMATELY  2 MINUTES PAST [employer] ON THE RIGHT. ONCE YOUR COVID TEST IS COMPLETED,  PLEASE WEAR A MASK WHEN IN PUBLIC                Charles Galloway  07/05/2020   Your procedure is scheduled on: 07-16-20   Report to [facility] Main  Entrance   Report to admitting at      [place] AM     Call this number if you have problems the morning of surgery [phone]    Remember: Drink one bottle of magnesium citrate by noon the day before surgery                                      Follow a clear liquid diet the day before your surgery    CLEAR LIQUID DIET   Foods Allowed                                                                                  Foods Excluded Water Black  Coffee and tea, regular and decaf                                        liquids that you cannot  Plain Jell-O any favor except red or purple                                           see through such as: Fruit ices (not with fruit pulp)                                                         milk, soups, orange juice  Iced Popsicles                                                     All solid food  Cranberry, grape and apple juices Sports drinks like Gatorade Lightly seasoned clear broth or consume(fat free) Sugar, honey syrup  Sample Menu Breakfast                                Lunch                                     Supper Cranberry juice                    Beef broth                             Chicken broth Jell-O                                     Grape juice                           Apple juice Coffee or tea                        Jell-O                                      Popsicle                                                Coffee or tea                        Coffee or tea  _____________________________________________________________________     BRUSH YOUR TEETH MORNING OF SURGERY AND RINSE YOUR MOUTH OUT, NO CHEWING GUM CANDY OR MINTS.     Take these medicines the morning of surgery with A SIP OF WATER: xanax if needed, lexapro, zocor                                 You may not have any metal on your body including hair pins and              piercings  Do not wear jewelry,lotions, powders or perfumes, deodorant                     Men may shave face and neck.   Do not bring valuables to the hospital. [place].  Contacts, dentures or bridgework may not be worn into surgery.      Patients discharged the day of surgery will not be allowed to drive home. IF YOU ARE HAVING SURGERY AND GOING HOME THE SAME DAY, YOU MUST HAVE AN ADULT TO DRIVE YOU HOME AND BE WITH YOU FOR 24 HOURS. YOU MAY GO HOME BY TAXI OR UBER OR ORTHERWISE, BUT AN ADULT MUST ACCOMPANY YOU HOME AND STAY WITH YOU FOR 24  HOURS.  Name and phone number of your driver:  Special Instructions: N/A              Please read over the following fact sheets you were given: _____________________________________________________________________             [facility] - Preparing for Surgery Before surgery, you can play an important role.  Because skin is not sterile, your skin needs to be as free of germs as possible.  You can reduce the number of germs on your skin by washing with CHG (chlorahexidine gluconate) soap before surgery.  CHG is an antiseptic cleaner which kills germs and bonds with the skin to continue killing germs even after washing. Please DO  NOT use if you have an allergy to CHG or antibacterial soaps.  If your skin becomes reddened/irritated stop using the CHG and inform your nurse when you arrive at Short Stay. Do not shave (including legs and underarms) for at least 48 hours prior to the first CHG shower.  You may shave your face/neck. Please follow these instructions carefully:  1.  Shower with CHG Soap the night before surgery and the  morning of Surgery.  2.  If you choose to wash your hair, wash your hair first as usual with your  normal  shampoo.  3.  After you shampoo, rinse your hair and body thoroughly to remove the  shampoo.                           4.  Use CHG as you would any other liquid soap.  You can apply chg directly  to the skin and wash                       Gently with a scrungie or clean washcloth.  5.  Apply the CHG Soap to your body ONLY FROM THE NECK DOWN.   Do not use on face/ open                           Wound or open sores. Avoid contact with eyes, ears mouth and genitals (private parts).                       Wash face,  Genitals (private parts) with your normal soap.             6.  Wash thoroughly, paying special attention to the area where your surgery  will be performed.  7.  Thoroughly rinse your body with warm water from the neck down.  8.  DO NOT shower/wash with your normal soap after using and rinsing off  the CHG Soap.                9.  Pat yourself dry with a clean towel.            10.  Wear clean pajamas.            11.  Place clean sheets on your bed the night of your first shower and do not  sleep with pets. Day of Surgery : Do not apply any lotions/deodorants the morning of surgery.  Please wear clean clothes to the hospital/surgery center.  FAILURE TO FOLLOW THESE INSTRUCTIONS MAY RESULT IN THE CANCELLATION OF YOUR SURGERY PATIENT SIGNATURE_________________________________  NURSE  SIGNATURE__________________________________  ________________________________________________________________________

## 2020-07-07 ENCOUNTER — Encounter (HOSPITAL_COMMUNITY)
Admission: RE | Admit: 2020-07-07 | Discharge: 2020-07-07 | Disposition: A | Discharge status: Home / Self Care | Payer: Medicare Other | Source: Ambulatory Visit | Attending: Urology | Admitting: Urology

## 2020-07-07 ENCOUNTER — Other Ambulatory Visit: Payer: Self-pay

## 2020-07-07 ENCOUNTER — Encounter (HOSPITAL_COMMUNITY): Payer: Self-pay

## 2020-07-07 ENCOUNTER — Encounter (INDEPENDENT_AMBULATORY_CARE_PROVIDER_SITE_OTHER): Payer: Self-pay

## 2020-07-07 DIAGNOSIS — Z01818 Encounter for other preprocedural examination: Secondary | ICD-10-CM | POA: Insufficient documentation

## 2020-07-07 HISTORY — DX: Urinary tract infection, site not specified: N39.0

## 2020-07-07 HISTORY — DX: Malignant (primary) neoplasm, unspecified: C80.1

## 2020-07-07 HISTORY — DX: Anxiety disorder, unspecified: F41.9

## 2020-07-07 HISTORY — DX: Presence of other specified devices: Z97.8

## 2020-07-07 LAB — CBC
HCT: 33.9 % — ABNORMAL LOW (ref 39.0–52.0)
Hemoglobin: 10.8 g/dL — ABNORMAL LOW (ref 13.0–17.0)
MCH: 30.5 pg (ref 26.0–34.0)
MCHC: 31.9 g/dL (ref 30.0–36.0)
MCV: 95.8 fL (ref 80.0–100.0)
Platelets: 247 10*3/uL (ref 150–400)
RBC: 3.54 MIL/uL — ABNORMAL LOW (ref 4.22–5.81)
RDW: 14.7 % (ref 11.5–15.5)
WBC: 7.2 10*3/uL (ref 4.0–10.5)
nRBC: 0 % (ref 0.0–0.2)

## 2020-07-07 LAB — BASIC METABOLIC PANEL
Anion gap: 9 (ref 5–15)
BUN: 44 mg/dL — ABNORMAL HIGH (ref 8–23)
CO2: 24 mmol/L (ref 22–32)
Calcium: 9.2 mg/dL (ref 8.9–10.3)
Chloride: 106 mmol/L (ref 98–111)
Creatinine, Ser: 1.99 mg/dL — ABNORMAL HIGH (ref 0.61–1.24)
GFR, Estimated: 34 mL/min — ABNORMAL LOW (ref >60)
Glucose, Bld: 102 mg/dL — ABNORMAL HIGH (ref 70–99)
Potassium: 4.7 mmol/L (ref 3.5–5.1)
Sodium: 139 mmol/L (ref 135–145)

## 2020-07-12 DIAGNOSIS — M62838 Other muscle spasm: Secondary | ICD-10-CM | POA: Diagnosis not present

## 2020-07-12 DIAGNOSIS — M6281 Muscle weakness (generalized): Secondary | ICD-10-CM | POA: Diagnosis not present

## 2020-07-12 DIAGNOSIS — N393 Stress incontinence (female) (male): Secondary | ICD-10-CM | POA: Diagnosis not present

## 2020-07-14 ENCOUNTER — Other Ambulatory Visit (HOSPITAL_COMMUNITY)
Admission: RE | Admit: 2020-07-14 | Discharge: 2020-07-14 | Disposition: A | Discharge status: Home / Self Care | Payer: Medicare Other | Source: Ambulatory Visit | Attending: Urology | Admitting: Urology

## 2020-07-14 DIAGNOSIS — Z01812 Encounter for preprocedural laboratory examination: Secondary | ICD-10-CM | POA: Diagnosis not present

## 2020-07-14 DIAGNOSIS — Z20822 Contact with and (suspected) exposure to covid-19: Secondary | ICD-10-CM | POA: Insufficient documentation

## 2020-07-14 LAB — SARS CORONAVIRUS 2 (TAT 6-24 HRS): SARS Coronavirus 2: NEGATIVE

## 2020-07-16 ENCOUNTER — Observation Stay (HOSPITAL_COMMUNITY)
Admission: RE | Admit: 2020-07-16 | Discharge: 2020-07-17 | Disposition: A | Discharge status: Home / Self Care | Payer: Medicare Other | Attending: Urology | Admitting: Urology

## 2020-07-16 ENCOUNTER — Ambulatory Visit (HOSPITAL_COMMUNITY): Payer: Medicare Other | Admitting: Physician Assistant

## 2020-07-16 ENCOUNTER — Encounter (HOSPITAL_COMMUNITY): Payer: Self-pay | Admitting: Urology

## 2020-07-16 ENCOUNTER — Other Ambulatory Visit: Payer: Self-pay

## 2020-07-16 ENCOUNTER — Encounter (HOSPITAL_COMMUNITY)
Admission: RE | Disposition: A | Discharge status: Home / Self Care | Payer: Self-pay | Source: Home / Self Care | Attending: Urology

## 2020-07-16 ENCOUNTER — Ambulatory Visit (HOSPITAL_COMMUNITY): Payer: Medicare Other | Admitting: Certified Registered Nurse Anesthetist

## 2020-07-16 DIAGNOSIS — C61 Malignant neoplasm of prostate: Principal | ICD-10-CM | POA: Insufficient documentation

## 2020-07-16 DIAGNOSIS — Z79899 Other long term (current) drug therapy: Secondary | ICD-10-CM | POA: Insufficient documentation

## 2020-07-16 DIAGNOSIS — N401 Enlarged prostate with lower urinary tract symptoms: Secondary | ICD-10-CM | POA: Diagnosis not present

## 2020-07-16 DIAGNOSIS — R339 Retention of urine, unspecified: Secondary | ICD-10-CM | POA: Diagnosis present

## 2020-07-16 DIAGNOSIS — I10 Essential (primary) hypertension: Secondary | ICD-10-CM | POA: Insufficient documentation

## 2020-07-16 DIAGNOSIS — Z87891 Personal history of nicotine dependence: Secondary | ICD-10-CM | POA: Diagnosis not present

## 2020-07-16 DIAGNOSIS — R338 Other retention of urine: Secondary | ICD-10-CM | POA: Diagnosis not present

## 2020-07-16 HISTORY — PX: LYMPH NODE DISSECTION: SHX5087

## 2020-07-16 HISTORY — PX: ROBOT ASSISTED LAPAROSCOPIC RADICAL PROSTATECTOMY: SHX5141

## 2020-07-16 SURGERY — PROSTATECTOMY, RADICAL, ROBOT-ASSISTED, LAPAROSCOPIC
Anesthesia: General | Site: Prostate

## 2020-07-16 MED ORDER — MIDAZOLAM HCL 2 MG/2ML IJ SOLN
INTRAMUSCULAR | Status: DC | PRN
  Administered 2020-07-16: 2 mg via INTRAVENOUS

## 2020-07-16 MED ORDER — CEPHALEXIN 500 MG PO CAPS: 500.0000 mg | ORAL_CAPSULE | Freq: Two times a day (BID) | ORAL | 0 refills | Status: AC

## 2020-07-16 MED ORDER — OXYCODONE HCL 5 MG PO TABS
5.0000 mg | ORAL_TABLET | ORAL | Status: DC | PRN
  Administered 2020-07-16 (×2): 10 mg via ORAL
  Administered 2020-07-17: 5 mg via ORAL
  Filled 2020-07-16: qty 1
  Filled 2020-07-16 (×2): qty 2

## 2020-07-16 MED ORDER — DEXAMETHASONE SODIUM PHOSPHATE 10 MG/ML IJ SOLN
INTRAMUSCULAR | Status: AC
  Filled 2020-07-16: qty 1

## 2020-07-16 MED ORDER — ROCURONIUM BROMIDE 10 MG/ML (PF) SYRINGE
PREFILLED_SYRINGE | INTRAVENOUS | Status: AC
  Filled 2020-07-16: qty 10

## 2020-07-16 MED ORDER — SIMETHICONE 80 MG PO CHEW: 40.0000 mg | CHEWABLE_TABLET | Freq: Four times a day (QID) | ORAL | Status: DC | PRN

## 2020-07-16 MED ORDER — ONDANSETRON HCL 4 MG/2ML IJ SOLN
INTRAMUSCULAR | Status: DC | PRN
  Administered 2020-07-16: 4 mg via INTRAVENOUS

## 2020-07-16 MED ORDER — PROPOFOL 10 MG/ML IV BOLUS
INTRAVENOUS | Status: AC
  Filled 2020-07-16: qty 20

## 2020-07-16 MED ORDER — DEXAMETHASONE SODIUM PHOSPHATE 10 MG/ML IJ SOLN
INTRAMUSCULAR | Status: DC | PRN
  Administered 2020-07-16: 10 mg via INTRAVENOUS

## 2020-07-16 MED ORDER — STERILE WATER FOR IRRIGATION IR SOLN
Status: DC | PRN
  Administered 2020-07-16: 1000 mL

## 2020-07-16 MED ORDER — ACETAMINOPHEN 500 MG PO TABS
ORAL_TABLET | ORAL | Status: AC
  Administered 2020-07-16: 1000 mg via ORAL
  Filled 2020-07-16: qty 2

## 2020-07-16 MED ORDER — SODIUM CHLORIDE 0.9 % IV SOLN
2.0000 g | INTRAVENOUS | Status: AC
  Administered 2020-07-16: 2 g via INTRAVENOUS
  Filled 2020-07-16: qty 20

## 2020-07-16 MED ORDER — LACTATED RINGERS IV SOLN: INTRAVENOUS | Status: DC

## 2020-07-16 MED ORDER — MAGNESIUM CITRATE PO SOLN: 1.0000 | Freq: Once | ORAL | Status: DC

## 2020-07-16 MED ORDER — HYDROCODONE-ACETAMINOPHEN 5-325 MG PO TABS: 1.0000 | ORAL_TABLET | Freq: Four times a day (QID) | ORAL | 0 refills | Status: AC | PRN

## 2020-07-16 MED ORDER — MIDAZOLAM HCL 2 MG/2ML IJ SOLN
INTRAMUSCULAR | Status: AC
  Filled 2020-07-16: qty 2

## 2020-07-16 MED ORDER — BUPIVACAINE LIPOSOME 1.3 % IJ SUSP
20.0000 mL | Freq: Once | INTRAMUSCULAR | Status: AC
  Administered 2020-07-16: 20 mL
  Filled 2020-07-16: qty 20

## 2020-07-16 MED ORDER — LACTATED RINGERS IR SOLN
Status: DC | PRN
  Administered 2020-07-16: 1000 mL

## 2020-07-16 MED ORDER — ONDANSETRON HCL 4 MG/2ML IJ SOLN: 4.0000 mg | Freq: Four times a day (QID) | INTRAMUSCULAR | Status: DC | PRN

## 2020-07-16 MED ORDER — SODIUM CHLORIDE (PF) 0.9 % IJ SOLN
INTRAMUSCULAR | Status: AC
  Filled 2020-07-16: qty 20

## 2020-07-16 MED ORDER — SODIUM CHLORIDE (PF) 0.9 % IJ SOLN
INTRAMUSCULAR | Status: DC | PRN
  Administered 2020-07-16: 20 mL

## 2020-07-16 MED ORDER — ONDANSETRON 4 MG PO TBDP: 4.0000 mg | ORAL_TABLET | Freq: Four times a day (QID) | ORAL | Status: DC | PRN

## 2020-07-16 MED ORDER — SENNA 8.6 MG PO TABS
1.0000 | ORAL_TABLET | Freq: Two times a day (BID) | ORAL | Status: DC
  Administered 2020-07-16 – 2020-07-17 (×2): 8.6 mg via ORAL
  Filled 2020-07-16 (×2): qty 1

## 2020-07-16 MED ORDER — ONDANSETRON HCL 4 MG/2ML IJ SOLN
INTRAMUSCULAR | Status: AC
  Filled 2020-07-16: qty 2

## 2020-07-16 MED ORDER — LIDOCAINE HCL (CARDIAC) PF 100 MG/5ML IV SOSY
PREFILLED_SYRINGE | INTRAVENOUS | Status: DC | PRN
  Administered 2020-07-16: 60 mg via INTRAVENOUS

## 2020-07-16 MED ORDER — DOCUSATE SODIUM 100 MG PO CAPS
100.0000 mg | ORAL_CAPSULE | Freq: Two times a day (BID) | ORAL | Status: DC
  Administered 2020-07-16 – 2020-07-17 (×2): 100 mg via ORAL
  Filled 2020-07-16 (×2): qty 1

## 2020-07-16 MED ORDER — ROCURONIUM BROMIDE 10 MG/ML (PF) SYRINGE
PREFILLED_SYRINGE | INTRAVENOUS | Status: DC | PRN
  Administered 2020-07-16: 50 mg via INTRAVENOUS
  Administered 2020-07-16: 20 mg via INTRAVENOUS

## 2020-07-16 MED ORDER — HYDROMORPHONE HCL 1 MG/ML IJ SOLN
INTRAMUSCULAR | Status: AC
  Filled 2020-07-16: qty 1

## 2020-07-16 MED ORDER — SENNOSIDES-DOCUSATE SODIUM 8.6-50 MG PO TABS: 1.0000 | ORAL_TABLET | Freq: Two times a day (BID) | ORAL | 0 refills | Status: AC

## 2020-07-16 MED ORDER — ORAL CARE MOUTH RINSE: 15.0000 mL | Freq: Once | OROMUCOSAL | Status: AC

## 2020-07-16 MED ORDER — ACETAMINOPHEN 500 MG PO TABS
1000.0000 mg | ORAL_TABLET | Freq: Four times a day (QID) | ORAL | Status: DC
  Administered 2020-07-16 – 2020-07-17 (×4): 1000 mg via ORAL
  Filled 2020-07-16 (×3): qty 2

## 2020-07-16 MED ORDER — HYDROMORPHONE HCL 1 MG/ML IJ SOLN
0.2500 mg | INTRAMUSCULAR | Status: DC | PRN
  Administered 2020-07-16: 0.25 mg via INTRAVENOUS
  Administered 2020-07-16 (×2): 0.5 mg via INTRAVENOUS

## 2020-07-16 MED ORDER — SUGAMMADEX SODIUM 200 MG/2ML IV SOLN
INTRAVENOUS | Status: DC | PRN
  Administered 2020-07-16: 200 mg via INTRAVENOUS

## 2020-07-16 MED ORDER — LIDOCAINE 2% (20 MG/ML) 5 ML SYRINGE
INTRAMUSCULAR | Status: AC
  Filled 2020-07-16: qty 5

## 2020-07-16 MED ORDER — DEXMEDETOMIDINE (PRECEDEX) IN NS 20 MCG/5ML (4 MCG/ML) IV SYRINGE
PREFILLED_SYRINGE | INTRAVENOUS | Status: DC | PRN
  Administered 2020-07-16: 10 ug via INTRAVENOUS

## 2020-07-16 MED ORDER — FENTANYL CITRATE (PF) 250 MCG/5ML IJ SOLN
INTRAMUSCULAR | Status: AC
  Filled 2020-07-16: qty 5

## 2020-07-16 MED ORDER — CHLORHEXIDINE GLUCONATE 0.12 % MT SOLN
15.0000 mL | Freq: Once | OROMUCOSAL | Status: AC
  Administered 2020-07-16: 15 mL via OROMUCOSAL

## 2020-07-16 MED ORDER — PROPOFOL 10 MG/ML IV BOLUS
INTRAVENOUS | Status: DC | PRN
  Administered 2020-07-16 (×2): 20 mg via INTRAVENOUS
  Administered 2020-07-16: 160 mg via INTRAVENOUS

## 2020-07-16 MED ORDER — ACETAMINOPHEN 500 MG PO TABS: 1000.0000 mg | ORAL_TABLET | Freq: Once | ORAL | Status: AC

## 2020-07-16 MED ORDER — FENTANYL CITRATE (PF) 250 MCG/5ML IJ SOLN
INTRAMUSCULAR | Status: DC | PRN
  Administered 2020-07-16 (×3): 50 ug via INTRAVENOUS
  Administered 2020-07-16: 100 ug via INTRAVENOUS

## 2020-07-16 SURGICAL SUPPLY — 66 items
APPLICATOR COTTON TIP 6 STRL (MISCELLANEOUS) ×2 IMPLANT
APPLICATOR COTTON TIP 6IN STRL (MISCELLANEOUS) ×3
CATH FOLEY 2WAY SLVR 18FR 30CC (CATHETERS) ×3 IMPLANT
CATH TIEMANN FOLEY 18FR 5CC (CATHETERS) ×3 IMPLANT
CHLORAPREP W/TINT 26 (MISCELLANEOUS) ×3 IMPLANT
CLIP VESOLOCK LG 6/CT PURPLE (CLIP) ×6 IMPLANT
CNTNR URN SCR LID CUP LEK RST (MISCELLANEOUS) ×2 IMPLANT
CONT SPEC 4OZ STRL OR WHT (MISCELLANEOUS) ×1
COVER SURGICAL LIGHT HANDLE (MISCELLANEOUS) ×3 IMPLANT
COVER TIP SHEARS 8 DVNC (MISCELLANEOUS) ×2 IMPLANT
COVER TIP SHEARS 8MM DA VINCI (MISCELLANEOUS) ×1
COVER WAND RF STERILE (DRAPES) IMPLANT
CUTTER ECHEON FLEX ENDO 45 340 (ENDOMECHANICALS) ×3 IMPLANT
DECANTER SPIKE VIAL GLASS SM (MISCELLANEOUS) ×3 IMPLANT
DERMABOND ADVANCED (GAUZE/BANDAGES/DRESSINGS) ×1
DERMABOND ADVANCED .7 DNX12 (GAUZE/BANDAGES/DRESSINGS) ×2 IMPLANT
DRAIN CHANNEL RND F F (WOUND CARE) IMPLANT
DRAPE ARM DVNC X/XI (DISPOSABLE) ×8 IMPLANT
DRAPE COLUMN DVNC XI (DISPOSABLE) ×2 IMPLANT
DRAPE DA VINCI XI ARM (DISPOSABLE) ×4
DRAPE DA VINCI XI COLUMN (DISPOSABLE) ×1
DRAPE SURG IRRIG POUCH 19X23 (DRAPES) ×3 IMPLANT
DRSG TEGADERM 4X4.75 (GAUZE/BANDAGES/DRESSINGS) ×3 IMPLANT
ELECT PENCIL ROCKER SW 15FT (MISCELLANEOUS) ×3 IMPLANT
ELECT REM PT RETURN 15FT ADLT (MISCELLANEOUS) ×3 IMPLANT
GAUZE 4X4 16PLY RFD (DISPOSABLE) IMPLANT
GAUZE SPONGE 2X2 8PLY STRL LF (GAUZE/BANDAGES/DRESSINGS) ×2 IMPLANT
GLOVE SURG ENC MOIS LTX SZ6.5 (GLOVE) ×3 IMPLANT
GLOVE SURG ENC TEXT LTX SZ7.5 (GLOVE) ×6 IMPLANT
GLOVE SURG UNDER POLY LF SZ7.5 (GLOVE) ×3 IMPLANT
GOWN STRL REUS W/TWL LRG LVL3 (GOWN DISPOSABLE) ×9 IMPLANT
HOLDER FOLEY CATH W/STRAP (MISCELLANEOUS) ×3 IMPLANT
IRRIG SUCT STRYKERFLOW 2 WTIP (MISCELLANEOUS) ×3
IRRIGATION SUCT STRKRFLW 2 WTP (MISCELLANEOUS) ×2 IMPLANT
IV LACTATED RINGERS 1000ML (IV SOLUTION) ×3 IMPLANT
KIT PROCEDURE DA VINCI SI (MISCELLANEOUS) ×1
KIT PROCEDURE DVNC SI (MISCELLANEOUS) ×2 IMPLANT
KIT TURNOVER KIT A (KITS) ×3 IMPLANT
NEEDLE INSUFFLATION 14GA 120MM (NEEDLE) ×3 IMPLANT
NEEDLE SPNL 22GX7 QUINCKE BK (NEEDLE) ×3 IMPLANT
PACK ROBOT UROLOGY CUSTOM (CUSTOM PROCEDURE TRAY) ×3 IMPLANT
PAD POSITIONING PINK XL (MISCELLANEOUS) ×3 IMPLANT
PORT ACCESS TROCAR AIRSEAL 12 (TROCAR) ×2 IMPLANT
PORT ACCESS TROCAR AIRSEAL 5M (TROCAR) ×1
SEAL CANN UNIV 5-8 DVNC XI (MISCELLANEOUS) ×8 IMPLANT
SEAL XI 5MM-8MM UNIVERSAL (MISCELLANEOUS) ×4
SET TRI-LUMEN FLTR TB AIRSEAL (TUBING) ×3 IMPLANT
SOLUTION ELECTROLUBE (MISCELLANEOUS) ×3 IMPLANT
SPONGE GAUZE 2X2 STER 10/PKG (GAUZE/BANDAGES/DRESSINGS) ×1
SPONGE LAP 4X18 RFD (DISPOSABLE) ×3 IMPLANT
STAPLE RELOAD 45 GRN (STAPLE) ×2 IMPLANT
STAPLE RELOAD 45MM GREEN (STAPLE) ×1
SUT ETHILON 3 0 PS 1 (SUTURE) ×3 IMPLANT
SUT MNCRL AB 4-0 PS2 18 (SUTURE) ×6 IMPLANT
SUT PDS AB 1 CT1 27 (SUTURE) ×6 IMPLANT
SUT VIC AB 0 CT1 27 (SUTURE) ×1
SUT VIC AB 0 CT1 27XBRD ANTBC (SUTURE) ×2 IMPLANT
SUT VIC AB 2-0 SH 27 (SUTURE) ×1
SUT VIC AB 2-0 SH 27X BRD (SUTURE) ×2 IMPLANT
SUT VICRYL 0 UR6 27IN ABS (SUTURE) ×3 IMPLANT
SUT VLOC BARB 180 ABS3/0GR12 (SUTURE) ×9
SUTURE VLOC BRB 180 ABS3/0GR12 (SUTURE) ×6 IMPLANT
SYR 27GX1/2 1ML LL SAFETY (SYRINGE) ×3 IMPLANT
TOWEL OR NON WOVEN STRL DISP B (DISPOSABLE) ×3 IMPLANT
TROCAR XCEL NON-BLD 5MMX100MML (ENDOMECHANICALS) IMPLANT
WATER STERILE IRR 1000ML POUR (IV SOLUTION) ×3 IMPLANT

## 2020-07-16 NOTE — Discharge Instructions (Signed)

## 2020-07-16 NOTE — Anesthesia Postprocedure Evaluation (Signed)
Anesthesia Post Note  Patient: KOURTNEY MONTESINOS  Procedure(s) Performed: XI ROBOTIC ASSISTED LAPAROSCOPIC RADICAL PROSTATECTOMY (N/A Prostate) LIMITED LYMPH NODE DISSECTION (Bilateral Abdomen)     Patient location during evaluation: PACU Anesthesia Type: General Level of consciousness: awake and alert Pain management: pain level controlled Vital Signs Assessment: post-procedure vital signs reviewed and stable Respiratory status: spontaneous breathing, nonlabored ventilation, respiratory function stable and patient connected to nasal cannula oxygen Cardiovascular status: blood pressure returned to baseline and stable Postop Assessment: no apparent nausea or vomiting Anesthetic complications: no   No complications documented.  Last Vitals:  Vitals:   07/16/20 1600 07/16/20 1615  BP: (!) 148/79 140/73  Pulse: 83 82  Resp: 12 10  Temp:  36.7 C  SpO2: 95% 94%    Last Pain:  Vitals:   07/16/20 1615  TempSrc:   PainSc: Asleep                 Tiajuana Amass

## 2020-07-16 NOTE — Brief Op Note (Signed)
07/16/2020  2:42 PM  PATIENT:  Charles Galloway  75 y.o. male  PRE-OPERATIVE DIAGNOSIS:  PROSTATE CANCER IN LARGE PROSTATE  POST-OPERATIVE DIAGNOSIS:  prostate  cancer in large prostate  PROCEDURE:  Procedure(s): XI ROBOTIC ASSISTED LAPAROSCOPIC RADICAL PROSTATECTOMY (N/A) LIMITED LYMPH NODE DISSECTION (Bilateral)  SURGEON:  Surgeon(s) and Role:    Alexis Frock, MD - Primary  PHYSICIAN ASSISTANT:   ASSISTANTSErmelinda Das MD   ANESTHESIA:   local and general  EBL:  100 mL   BLOOD ADMINISTERED:none  DRAINS: 1 - JP to bulb; 2 - Foley to gravity    LOCAL MEDICATIONS USED:  MARCAINE     SPECIMEN:  Source of Specimen:  1 - periprostatic fat; 2 - pelvic lymph nodes; 3 - prostate  DISPOSITION OF SPECIMEN:  PATHOLOGY  COUNTS:  YES  TOURNIQUET:  * No tourniquets in log *  DICTATION: .Other Dictation: Dictation Number [id number]  PLAN OF CARE: Admit for overnight observation  PATIENT DISPOSITION:  PACU - hemodynamically stable.   Delay start of Pharmacological VTE agent (>24hrs) due to surgical blood loss or risk of bleeding: yes

## 2020-07-16 NOTE — H&P (Signed)
Charles Galloway is an 75 y.o. male.    Chief Complaint: Pre-Op Prostatectomy  HPI:   1 - Lower Urinary Tract Symptoms, elevated PVR - Pt with slolwy progressive mix of irritative and obstructive symptoms with some weak stream and urgency. DRE 10/2014 45gm. PVR 10/2014 "252mL". Refused outlet meds x years until frank retention 2022.   2 - Moderate Risk Prostate Cancer - 1 core 20% Grade 1 cancer (LLB) by BX =05/2020 on eval PSA 20.6 and retention, Prostate vol 94 ([place]) - 160 (CT)gms with intravesical protrusion, not sig median.   3 -  Hematuria, Pyuria, Dysuria, Urinary Retention - few spells of irritative voiding and hematuria 2022. Given few ABX by urgent care for possilbe staph UTI but not much improvement. Not diabetic. DRE 05/2020 80GM with large R>L assymetry (worse but non tender), UA large pyria but not much bacteruria, PVR >2573mL and 7F foley placed. CT 05/2020 large bilateral hydro to bladder and trilobar prostatic hypertrophy 160gm+, no adenopathy. Cysto with just trilobar prostatic hypertrophy, no urothelial lesions.   PMH sig for Rt inguinal hernia repair as child. No CV disease. No strong blood thinners. He is the father of Charles Galloway with GI here in [place].  His PCP is Dr. Brigitte Galloway.    Today " Charles Galloway " is seen to proceed with radial prostatecotmy with limited node dissection for indication of refractory retention and prostate cancer. C19 screen negative. Hgb 10.8, Cr 1.99.    Past Medical History:  Diagnosis Date  . Anxiety   . Cancer [facility])    prostate  . Foley catheter in place    07-07-20  . Hyperlipidemia   . Hypertension    no meds at this time  . Personal history of colonic polyps - pre-cancerous 11/05/2012  . UTI (urinary tract infection)     Past Surgical History:  Procedure Laterality Date  . COLONOSCOPY    . EYE SURGERY     cataract bil , retina Left eye  . HERNIA REPAIR     75 years old  . TOOTH EXTRACTION  10/2012    Family History  Problem Relation Age of  Onset  . Lung cancer Father   . Colon cancer Neg Hx   . Esophageal cancer Neg Hx   . Rectal cancer Neg Hx   . Stomach cancer Neg Hx    Social History:  reports that he has quit smoking. He started smoking about 38 years ago. He has never used smokeless tobacco. He reports current alcohol use of about 14.0 standard drinks of alcohol per week. He reports that he does not use drugs.  Allergies: No Known Allergies  No medications prior to admission.    Results for orders placed or performed during the hospital encounter of 07/14/20 (from the past 48 hour(s))  SARS CORONAVIRUS 2 (TAT 6-24 HRS) Nasopharyngeal Nasopharyngeal Swab     Status: None   Collection Time: 07/14/20  9:06 AM   Specimen: Nasopharyngeal Swab  Result Value Ref Range   SARS Coronavirus 2 NEGATIVE NEGATIVE    Comment: (NOTE) SARS-CoV-2 target nucleic acids are NOT DETECTED.  The SARS-CoV-2 RNA is generally detectable in upper and lower respiratory specimens during the acute phase of infection. Negative results do not preclude SARS-CoV-2 infection, do not rule out co-infections with other pathogens, and should not be used as the sole basis for treatment or other patient management decisions. Negative results must be combined with clinical observations, patient history, and epidemiological information. The expected result is  Negative.  Fact Sheet for Patients: SugarRoll.be  Fact Sheet for Healthcare Providers: https://www.woods-mathews.com/  This test is not yet approved or cleared by the [place] FDA and  has been authorized for detection and/or diagnosis of SARS-CoV-2 by FDA under an Emergency Use Authorization (EUA). This EUA will remain  in effect (meaning this test can be used) for the duration of the COVID-19 declaration under Se ction 564(b)(1) of the Act, 21 U.S.C. section 360bbb-3(b)(1), unless the authorization is terminated or revoked sooner.  Performed  at [facility], [place] [address], [place], [place] [address]    No results found.  Review of Systems  Constitutional: Negative for chills and fever.  All other systems reviewed and are negative.   There were no vitals taken for this visit. Physical Exam Vitals reviewed.  HENT:     Head: Normocephalic.  Eyes:     Pupils: Pupils are equal, round, and reactive to light.  Cardiovascular:     Rate and Rhythm: Normal rate.  Pulmonary:     Effort: Pulmonary effort is normal.  Abdominal:     General: Abdomen is flat.     Comments: Stable mild-moderate truncal obesity.   Genitourinary:    Comments: Catheter in situ with non-foul urine.  Musculoskeletal:        General: Normal range of motion.     Cervical back: Normal range of motion.  Skin:    General: Skin is warm.  Neurological:     Mental Status: He is alert.  Psychiatric:        Mood and Affect: Mood normal.      Assessment/Plan  Proceed as planned with radical prostatectomy with limited node dissedtion for very large prostate with cancer in setting of refractory retention. Risks, benefits, alternatives, expected peri-op course discussed previously and reiterated today.  Alexis Frock, MD 07/16/2020, 5:46 AM

## 2020-07-16 NOTE — Anesthesia Procedure Notes (Signed)
Procedure Name: Intubation Date/Time: 07/16/2020 12:05 PM Performed by: Raenette Rover, CRNA Pre-anesthesia Checklist: Patient identified, Emergency Drugs available, Suction available and Patient being monitored Patient Re-evaluated:Patient Re-evaluated prior to induction Oxygen Delivery Method: Circle system utilized Preoxygenation: Pre-oxygenation with 100% oxygen Induction Type: IV induction Ventilation: Mask ventilation without difficulty Laryngoscope Size: Mac and 4 Grade View: Grade I Tube type: Oral Tube size: 7.5 mm Number of attempts: 1 Airway Equipment and Method: Stylet Placement Confirmation: ETT inserted through vocal cords under direct vision,  positive ETCO2 and breath sounds checked- equal and bilateral Secured at: 22 cm Tube secured with: Tape Dental Injury: Teeth and Oropharynx as per pre-operative assessment

## 2020-07-16 NOTE — Anesthesia Preprocedure Evaluation (Addendum)
Anesthesia Evaluation  Patient identified by MRN, date of birth, ID band Patient awake    Reviewed: Allergy & Precautions, H&P , NPO status , Patient's Chart, lab work & pertinent test results  Airway Mallampati: II  TM Distance: >3 FB Neck ROM: Full    Dental no notable dental hx. (+) Teeth Intact, Dental Advisory Given   Pulmonary neg pulmonary ROS, former smoker,    Pulmonary exam normal breath sounds clear to auscultation       Cardiovascular hypertension,  Rhythm:Regular Rate:Normal     Neuro/Psych Anxiety negative neurological ROS  negative psych ROS   GI/Hepatic negative GI ROS, Neg liver ROS,   Endo/Other  negative endocrine ROS  Renal/GU negative Renal ROS  negative genitourinary   Musculoskeletal   Abdominal   Peds  Hematology negative hematology ROS (+)   Anesthesia Other Findings   Reproductive/Obstetrics negative OB ROS                            Anesthesia Physical Anesthesia Plan  ASA: II  Anesthesia Plan: General   Post-op Pain Management:    Induction: Intravenous  PONV Risk Score and Plan: 3 and Ondansetron, Dexamethasone and Treatment may vary due to age or medical condition  Airway Management Planned: Oral ETT  Additional Equipment:   Intra-op Plan:   Post-operative Plan: Extubation in OR  Informed Consent: I have reviewed the patients History and Physical, chart, labs and discussed the procedure including the risks, benefits and alternatives for the proposed anesthesia with the patient or authorized representative who has indicated his/her understanding and acceptance.     Dental advisory given  Plan Discussed with: CRNA  Anesthesia Plan Comments:         Anesthesia Quick Evaluation

## 2020-07-16 NOTE — Transfer of Care (Signed)
Immediate Anesthesia Transfer of Care Note  Patient: Charles Galloway  Procedure(s) Performed: XI ROBOTIC ASSISTED LAPAROSCOPIC RADICAL PROSTATECTOMY (N/A Prostate) LIMITED LYMPH NODE DISSECTION (Bilateral Abdomen)  Patient Location: PACU  Anesthesia Type:General  Level of Consciousness: awake, drowsy and patient cooperative  Airway & Oxygen Therapy: Patient Spontanous Breathing and Patient connected to face mask oxygen  Post-op Assessment: Report given to RN and Post -op Vital signs reviewed and stable  Post vital signs: Reviewed and stable  Last Vitals:  Vitals Value Taken Time  BP 168/94 07/16/20 1449  Temp    Pulse 96 07/16/20 1451  Resp 17 07/16/20 1451  SpO2 96 % 07/16/20 1451  Vitals shown include unvalidated device data.  Last Pain:  Vitals:   07/16/20 0953  TempSrc: Oral  PainSc:          Complications: No complications documented.

## 2020-07-17 ENCOUNTER — Encounter (HOSPITAL_COMMUNITY): Payer: Self-pay | Admitting: Urology

## 2020-07-17 DIAGNOSIS — I10 Essential (primary) hypertension: Secondary | ICD-10-CM | POA: Diagnosis not present

## 2020-07-17 DIAGNOSIS — Z79899 Other long term (current) drug therapy: Secondary | ICD-10-CM | POA: Diagnosis not present

## 2020-07-17 DIAGNOSIS — C61 Malignant neoplasm of prostate: Secondary | ICD-10-CM | POA: Diagnosis not present

## 2020-07-17 DIAGNOSIS — Z87891 Personal history of nicotine dependence: Secondary | ICD-10-CM | POA: Diagnosis not present

## 2020-07-17 LAB — BASIC METABOLIC PANEL
Anion gap: 9 (ref 5–15)
BUN: 32 mg/dL — ABNORMAL HIGH (ref 8–23)
CO2: 25 mmol/L (ref 22–32)
Calcium: 8.6 mg/dL — ABNORMAL LOW (ref 8.9–10.3)
Chloride: 101 mmol/L (ref 98–111)
Creatinine, Ser: 1.88 mg/dL — ABNORMAL HIGH (ref 0.61–1.24)
GFR, Estimated: 37 mL/min — ABNORMAL LOW (ref >60)
Glucose, Bld: 151 mg/dL — ABNORMAL HIGH (ref 70–99)
Potassium: 5.2 mmol/L — ABNORMAL HIGH (ref 3.5–5.1)
Sodium: 135 mmol/L (ref 135–145)

## 2020-07-17 LAB — CBC
HCT: 30.9 % — ABNORMAL LOW (ref 39.0–52.0)
Hemoglobin: 9.8 g/dL — ABNORMAL LOW (ref 13.0–17.0)
MCH: 30.5 pg (ref 26.0–34.0)
MCHC: 31.7 g/dL (ref 30.0–36.0)
MCV: 96.3 fL (ref 80.0–100.0)
Platelets: 234 10*3/uL (ref 150–400)
RBC: 3.21 MIL/uL — ABNORMAL LOW (ref 4.22–5.81)
RDW: 13.9 % (ref 11.5–15.5)
WBC: 10 10*3/uL (ref 4.0–10.5)
nRBC: 0 % (ref 0.0–0.2)

## 2020-07-17 LAB — GLUCOSE, CAPILLARY
Glucose-Capillary: 131 mg/dL — ABNORMAL HIGH (ref 70–99)
Glucose-Capillary: 168 mg/dL — ABNORMAL HIGH (ref 70–99)

## 2020-07-17 MED ORDER — BISACODYL 10 MG RE SUPP
10.0000 mg | Freq: Once | RECTAL | Status: AC
  Administered 2020-07-17: 10 mg via RECTAL
  Filled 2020-07-17: qty 1

## 2020-07-17 NOTE — Op Note (Signed)
Charles Galloway, Charles Galloway MEDICAL RECORD NO: [id number] ACCOUNT NO: [id number] DATE OF BIRTH: March 16, 1945 FACILITY: [facility] LOCATION: [id number] PHYSICIAN: Alexis Frock, MD  Operative Report   DATE OF PROCEDURE: 07/16/2020  PREOPERATIVE DIAGNOSIS:  Prostate cancer and very large prostate with urinary retention.  PROCEDURE PERFORMED:   1.  Robotic-assisted laparoscopic radical prostatectomy. 2.  Bilateral pelvic lymphadenectomy.    ESTIMATED LOSS:  833 mL  COMPLICATIONS:  None.  SPECIMENS:   1.  Preprosthetic fat. 2.  Right external iliac lymph nodes. 3.  Right obturator lymph nodes. 4.  Left external iliac lymph nodes. 5.  Left obturator lymph nodes. 6.  Prostatectomy.  ASSISTANT:  Ermelinda Das, MD   DRAINS:   1.  Jackson-Pratt to bulb suction. 2.  Foley catheter to straight drain.  FINDINGS:   1.  Bilateral mild pelvic lymphadenopathy, favor reactive. 2.  Large trilobar prostatic hypertrophy as anticipated.   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  He wound up developing frank urinary retention  and acute renal failure was temporized with catheterization.  His renal function has partially recovered back to a safer level.  His PSA remains quite elevated.  Prostate biopsy did reveal low grade cancer. CT imaging without any  obvious locally advanced or distant disease.  He remained catheter dependent despite trial of oral medications.  Options were discussed for management including simple prostatectomy versus radical prostatectomy versus chronic catheterization and the role  of the radiation.  He was counseled towards prostatectomy with goal of curing the prostate cancer and life threatening retention.  Informed consent was obtained and placed in medical record.  DESCRIPTION OF PROCEDURE:  The patient being verified,  procedure being radical prostatectomy was confirmed.  Procedure timeout was performed.  Intravenous antibiotics administered.  General endotracheal anesthesia were induced.  The patient was placed in  a low lithotomy position.  Sterile field was created.  Prepped and draped the patient's penis, perineum and proximal thigh using iodine and his infraxiphoid abdomen using chlorhexidine gluconate, after clipper shaving and further fashioned on the  operating table using 3 inch tape with foam padding across the subxiphoid chest.  His arms were tucked to the side using gel rolls.  Steep Trendelenburg positioning was performed and found to be suitably positioned.  This was positioned in situ.  Foley  catheter had already been removed.  A new Foley catheter was placed per urethra for straight drain.  Next, a high-flow, low pressure pneumoperitoneum was obtained.  A Veress technique in the supraumbilical midline having passed the aspiration drop test.   An 8 mm robotic camera ports were placed in same location.  Laparoscope was then used in the peritoneal cavity and no significant adhesions and no visceral injury.  Additional ports were placed as follows.  Right paramedian 8 mm robotic port, left far  lateral 12 mm AirSeal assistant port, a right paramedian 5 mm suction port.  Left paramedian 8 mm robotic port, left far lateral 8 mm robotic port.  The robot was docked and passed the electronic checks.    Initial attention was directed to developing the space of Retzius.  Incision was made lateral to the right median umbilical ligament from the midline towards the area of internal ring coursing along the iliac vessels towards the right ureter.  Right vas  deferens was encountered, ligated, placed on gentle medial traction and the  right bladder wall swept away from the pelvic sidewall towards the area of the endopelvic fascia on the right side.  A mirror image dissection was performed on the left side.   Anterior  attachments taken down with cautery scissors and exposing the anterior base of the prostate, bladder neck junction, which was defatted to better denote this precise anatomy.  This fatty tissue was set aside and labeled as periprosthetic fat.   Next, the endopelvic fascia was carefully swept away from the lateral aspect of the prostate in a base to apex orientation.  This exposed the dorsal venous complex was controlled using green load stapler, which resulted in excellent hemostatic control of  the DVC without membranous urethral injury.  Attention was directed at lymphadenectomy first on the right side.  First all fibrofatty tissue with the boundaries of the right external iliac artery, the vein, pelvic sidewall and iliac bifurcation was  mobilized.  Lymphostasis was achieved with cold clips, set aside, labeled as right external iliac lymph nodes.  Next, the right obturator group was dissected free with the boundaries being right external iliac vein, pelvic sidewall, obturator nerve.   Lymphostasis was achieved with cold clips, set aside, labeled as right obturator lymph nodes.  The right obturator nerve was dissected bluntly.  The nerves were found to be uninjured.  Next, a mirror image lymphadenectomy was performed on the left side  with external iliac and left obturator group respectively and the left obturator nerve was also inspected and found to be uninjured.  Attention was directed at the bladder neck dissection.  Bladder neck was identified by moving the Foley catheter back  and forth and a lateral release was performed on each side to better approximate the bladder neck caliber.  The bladder neck was separated from the base of the prostate and anterior, posterior direction, keeping what appeared to be a rim of circular  muscle fibers at each plane of dissection towards the posterior aspect.  The patient was noted to have a very significant median lobe.  This was somewhat anticipated preoperatively.   The median lobe was delivered into the operative field and a  figure-of-eight stay suture of 0 Vicryl was placed through this to allow for anterior superior traction.  The posterior aspect of the bladder neck was carefully dissected away from this.  Posterior dissection was performed by incising approximately 7 mm  inferior posterior to the posterior lip of the prostate entering the plane of Denonvilliers.  Bilateral vas deferens were encountered, dissected for a distance of approximately 4 cm, ligated and placed in gentle superior traction.  Bilateral seminal  vesicles were dissected to their tips and placed on gentle superior traction.  Dissection then proceeded inferiorly towards the area of the apex of the prostate.  This exposed the vascular pedicles on each side was controlled using sequential clipping  technique and base to apex orientation bilaterally.  Final apical dissection was performed in the anterior plane placed on the prostate on superior traction and transecting coldly.  This completely freed up the very large prostate and the specimen was  placed in an EndoCatch bag for later retrieval.  Digital rectal exam was then performed as indicated above.  No evidence of rectal violation was noted.  Posterior reconstruction was performed using 3-0 V-Loc suture reapproximating the posterior urethral  plate to the posterior bladder neck, reconstructed in a tension-free apposition.  Next mucosa-to-mucosa anastomosis was performed using double armed 3-0 V-Loc suture reapproximating the bladder neck to the  membranous urethral stump from the 6 o'clock, 12  o'clock position.  Foley catheter was then easily placed, which irrigated quantitatively.  Sponge and needle counts were correct.  Hemostasis was excellent.  Close suction drain was brought through the previous left lateral most robotic port site.  The  peritoneal cavity was then undocked.  The previous right lateral most assistant port site was closed  with fascia using Carter-Thomason suture passer and 0 Vicryl.  Specimen was retrieved by extending the previous camera port site for a distance of  approximately 4 cm, removing the prostate and the specimen was then sent for permanent pathology.  This closed the level of fascia using figure-of-eight PDS x4 followed by reapproximation of Scarpa's with running Vicryl.  All incision sites were  infiltrated with diluted lipolized Marcaine and closed all the skin using subcuticular Monocryl and Dermabond.  The procedure was terminated.  The patient tolerated the procedure well, no immediate complications.  The patient was taken to postanesthesia  care unit in stable condition.  Plan for observation admission.   SUJ D: 07/16/2020 2:52:51 pm T: 07/17/2020 3:07:00 am  JOB: [id number]/ [id number]

## 2020-07-17 NOTE — Progress Notes (Signed)
Patient ID: Charles Galloway, male   DOB: 1945-12-13, 75 y.o.   MRN: [id number]   1 Day Post-Op Subjective: The patient is doing well.  No nausea or vomiting. Pain is adequately controlled.  Objective: Vital signs in last 24 hours: Temp:  [97.7 F (36.5 C)-98.4 F (36.9 C)] 97.8 F (36.6 C) (05/28 0603) Pulse Rate:  [62-96] 70 (05/28 0603) Resp:  [10-20] 20 (05/28 0603) BP: (120-174)/(66-94) 139/69 (05/28 0603) SpO2:  [94 %-100 %] 97 % (05/28 0603) Weight:  [77.6 kg] 77.6 kg (05/27 0949)  Intake/Output from previous day: 05/27 0701 - 05/28 0700 In: 3398.3 [P.O.:720; I.V.:2578.3; IV Piggyback:100] Out: 2700 [Urine:2400; Drains:200; Blood:100] Intake/Output this shift: No intake/output data recorded.  Physical Exam:  General: Alert and oriented. CV: RRR Lungs: Clear bilaterally. GI: Soft, Nondistended. Incisions: Clean, dry, and intact Urine: Clear Extremities: Nontender, no erythema, no edema.  Lab Results: Recent Labs    07/17/20 0139  HGB 9.8*  HCT 30.9*      Assessment/Plan: POD# 1 s/p robotic prostatectomy.  1) SL IVF 2) Ambulate, Incentive spirometry 3) Transition to oral pain medication 4) Dulcolax suppository 5) D/C pelvic drain 6) Plan for likely discharge later today   [name]. MD   LOS: 0 days   [name] 07/17/2020, 9:23 AM

## 2020-07-17 NOTE — Discharge Summary (Signed)
Date of admission: 07/16/2020  Date of discharge: 07/17/2020  Admission diagnosis: Prostate Cancer  Discharge diagnosis: Prostate Cancer  History and Physical: For full details, please see admission history and physical. Briefly, Charles Galloway is a 75 y.o. gentleman with localized prostate cancer.  After discussing management/treatment options, he elected to proceed with surgical treatment.  Hospital Course: Charles Galloway was taken to the operating room on 07/16/2020 and underwent a robotic assisted laparoscopic radical prostatectomy. He tolerated this procedure well and without complications. Postoperatively, he was able to be transferred to a regular hospital room following recovery from anesthesia.  He was able to begin ambulating the night of surgery. He remained hemodynamically stable overnight.  He had excellent urine output with appropriately minimal output from his pelvic drain and his pelvic drain was removed on POD #1.  He was transitioned to oral pain medication, tolerated a clear liquid diet, and had met all discharge criteria and was able to be discharged home later on POD#1.  Laboratory values: Recent Labs    07/17/20 0139  HGB 9.8*  HCT 30.9*    Disposition: Home  Discharge instruction: He was instructed to be ambulatory but to refrain from heavy lifting, strenuous activity, or driving. He was instructed on urethral catheter care.  Discharge medications:   Allergies as of 07/17/2020   No Known Allergies     Medication List    TAKE these medications   ALPRAZolam 0.5 MG tablet Commonly known as: XANAX Take 0.5 mg by mouth daily as needed for anxiety.   aspirin EC 81 MG tablet Take 81 mg by mouth daily. Swallow whole.   cephALEXin 500 MG capsule Commonly known as: KEFLEX Take 1 capsule (500 mg total) by mouth 2 (two) times daily for 3 days. Begin day before next Urology appointment.   escitalopram 10 MG tablet Commonly known as: LEXAPRO Take 10 mg by mouth  daily.   HYDROcodone-acetaminophen 5-325 MG tablet Commonly known as: NORCO/VICODIN Take 1-2 tablets by mouth every 6 (six) hours as needed for moderate pain or severe pain. Post-operatively   senna-docusate 8.6-50 MG tablet Commonly known as: Senokot-S Take 1 tablet by mouth 2 (two) times daily. While taking strong pain meds to prevent constipation   simvastatin 40 MG tablet Commonly known as: ZOCOR Take 40 mg by mouth daily.       Followup: He will followup in 1 week for catheter removal and to discuss his surgical pathology results.

## 2020-07-17 NOTE — Progress Notes (Signed)
Foley care and leg bag decucation done with the pt and his wife. Discharge instruction explained with medicines and follow up appointment. Verbalized understanding.

## 2020-07-17 NOTE — Plan of Care (Signed)
  Problem: Education: Goal: Knowledge of the procedure and recovery process will improve Outcome: Adequate for Discharge   Problem: Pain Management: Goal: General experience of comfort will improve Outcome: Adequate for Discharge   Problem: Urinary Elimination: Goal: Ability to avoid or minimize complications of infection will improve Outcome: Adequate for Discharge   Problem: Urinary Elimination: Goal: Ability to achieve and maintain urine output will improve Outcome: Adequate for Discharge

## 2020-07-22 LAB — SURGICAL PATHOLOGY

## 2020-08-24 DIAGNOSIS — M62838 Other muscle spasm: Secondary | ICD-10-CM | POA: Diagnosis not present

## 2020-08-24 DIAGNOSIS — N393 Stress incontinence (female) (male): Secondary | ICD-10-CM | POA: Diagnosis not present

## 2020-08-24 DIAGNOSIS — M6281 Muscle weakness (generalized): Secondary | ICD-10-CM | POA: Diagnosis not present

## 2020-08-27 DIAGNOSIS — Z125 Encounter for screening for malignant neoplasm of prostate: Secondary | ICD-10-CM | POA: Diagnosis not present

## 2020-08-27 DIAGNOSIS — R7301 Impaired fasting glucose: Secondary | ICD-10-CM | POA: Diagnosis not present

## 2020-08-27 DIAGNOSIS — E785 Hyperlipidemia, unspecified: Secondary | ICD-10-CM | POA: Diagnosis not present

## 2020-08-27 DIAGNOSIS — E559 Vitamin D deficiency, unspecified: Secondary | ICD-10-CM | POA: Diagnosis not present

## 2020-09-02 DIAGNOSIS — E559 Vitamin D deficiency, unspecified: Secondary | ICD-10-CM | POA: Diagnosis not present

## 2020-09-02 DIAGNOSIS — D3501 Benign neoplasm of right adrenal gland: Secondary | ICD-10-CM | POA: Diagnosis not present

## 2020-09-02 DIAGNOSIS — N400 Enlarged prostate without lower urinary tract symptoms: Secondary | ICD-10-CM | POA: Diagnosis not present

## 2020-09-02 DIAGNOSIS — Z1339 Encounter for screening examination for other mental health and behavioral disorders: Secondary | ICD-10-CM | POA: Diagnosis not present

## 2020-09-02 DIAGNOSIS — I700 Atherosclerosis of aorta: Secondary | ICD-10-CM | POA: Diagnosis not present

## 2020-09-02 DIAGNOSIS — Z1331 Encounter for screening for depression: Secondary | ICD-10-CM | POA: Diagnosis not present

## 2020-09-02 DIAGNOSIS — F329 Major depressive disorder, single episode, unspecified: Secondary | ICD-10-CM | POA: Diagnosis not present

## 2020-09-02 DIAGNOSIS — I10 Essential (primary) hypertension: Secondary | ICD-10-CM | POA: Diagnosis not present

## 2020-09-02 DIAGNOSIS — I129 Hypertensive chronic kidney disease with stage 1 through stage 4 chronic kidney disease, or unspecified chronic kidney disease: Secondary | ICD-10-CM | POA: Diagnosis not present

## 2020-09-02 DIAGNOSIS — N1832 Chronic kidney disease, stage 3b: Secondary | ICD-10-CM | POA: Diagnosis not present

## 2020-09-02 DIAGNOSIS — Z86010 Personal history of colonic polyps: Secondary | ICD-10-CM | POA: Diagnosis not present

## 2020-09-02 DIAGNOSIS — R7301 Impaired fasting glucose: Secondary | ICD-10-CM | POA: Diagnosis not present

## 2020-09-02 DIAGNOSIS — E039 Hypothyroidism, unspecified: Secondary | ICD-10-CM | POA: Diagnosis not present

## 2020-09-02 DIAGNOSIS — E785 Hyperlipidemia, unspecified: Secondary | ICD-10-CM | POA: Diagnosis not present

## 2020-09-02 DIAGNOSIS — R82998 Other abnormal findings in urine: Secondary | ICD-10-CM | POA: Diagnosis not present

## 2020-09-02 DIAGNOSIS — Z0000 Encounter for general adult medical examination without abnormal findings: Secondary | ICD-10-CM | POA: Diagnosis not present

## 2020-09-09 DIAGNOSIS — M6281 Muscle weakness (generalized): Secondary | ICD-10-CM | POA: Diagnosis not present

## 2020-09-09 DIAGNOSIS — N393 Stress incontinence (female) (male): Secondary | ICD-10-CM | POA: Diagnosis not present

## 2020-09-09 DIAGNOSIS — M62838 Other muscle spasm: Secondary | ICD-10-CM | POA: Diagnosis not present

## 2020-09-29 DIAGNOSIS — N393 Stress incontinence (female) (male): Secondary | ICD-10-CM | POA: Diagnosis not present

## 2020-09-29 DIAGNOSIS — M62838 Other muscle spasm: Secondary | ICD-10-CM | POA: Diagnosis not present

## 2020-09-29 DIAGNOSIS — M6281 Muscle weakness (generalized): Secondary | ICD-10-CM | POA: Diagnosis not present

## 2020-10-04 DIAGNOSIS — I10 Essential (primary) hypertension: Secondary | ICD-10-CM | POA: Diagnosis not present

## 2020-10-11 DIAGNOSIS — E785 Hyperlipidemia, unspecified: Secondary | ICD-10-CM | POA: Diagnosis not present

## 2020-10-28 DIAGNOSIS — N393 Stress incontinence (female) (male): Secondary | ICD-10-CM | POA: Diagnosis not present

## 2020-10-28 DIAGNOSIS — M62838 Other muscle spasm: Secondary | ICD-10-CM | POA: Diagnosis not present

## 2020-10-28 DIAGNOSIS — M6281 Muscle weakness (generalized): Secondary | ICD-10-CM | POA: Diagnosis not present

## 2020-10-28 DIAGNOSIS — C61 Malignant neoplasm of prostate: Secondary | ICD-10-CM | POA: Diagnosis not present

## 2020-11-08 DIAGNOSIS — N393 Stress incontinence (female) (male): Secondary | ICD-10-CM | POA: Diagnosis not present

## 2020-11-08 DIAGNOSIS — N5231 Erectile dysfunction following radical prostatectomy: Secondary | ICD-10-CM | POA: Diagnosis not present

## 2020-11-08 DIAGNOSIS — C61 Malignant neoplasm of prostate: Secondary | ICD-10-CM | POA: Diagnosis not present

## 2020-11-09 DIAGNOSIS — H524 Presbyopia: Secondary | ICD-10-CM | POA: Diagnosis not present

## 2020-11-09 DIAGNOSIS — H26491 Other secondary cataract, right eye: Secondary | ICD-10-CM | POA: Diagnosis not present

## 2020-11-17 DIAGNOSIS — Z23 Encounter for immunization: Secondary | ICD-10-CM | POA: Diagnosis not present

## 2020-12-22 DIAGNOSIS — H26491 Other secondary cataract, right eye: Secondary | ICD-10-CM | POA: Diagnosis not present

## 2021-05-03 DIAGNOSIS — C61 Malignant neoplasm of prostate: Secondary | ICD-10-CM | POA: Diagnosis not present

## 2021-05-10 DIAGNOSIS — C61 Malignant neoplasm of prostate: Secondary | ICD-10-CM | POA: Diagnosis not present

## 2021-05-10 DIAGNOSIS — N393 Stress incontinence (female) (male): Secondary | ICD-10-CM | POA: Diagnosis not present

## 2021-05-10 DIAGNOSIS — N5231 Erectile dysfunction following radical prostatectomy: Secondary | ICD-10-CM | POA: Diagnosis not present

## 2021-06-09 ENCOUNTER — Other Ambulatory Visit: Payer: Self-pay | Admitting: Internal Medicine

## 2021-06-09 DIAGNOSIS — D3501 Benign neoplasm of right adrenal gland: Secondary | ICD-10-CM

## 2021-07-01 ENCOUNTER — Other Ambulatory Visit: Payer: Self-pay | Admitting: Internal Medicine

## 2021-07-01 ENCOUNTER — Ambulatory Visit
Admission: RE | Admit: 2021-07-01 | Discharge: 2021-07-01 | Disposition: A | Discharge status: Home / Self Care | Payer: Medicare Other | Source: Ambulatory Visit | Attending: Internal Medicine | Admitting: Internal Medicine

## 2021-07-01 DIAGNOSIS — D3501 Benign neoplasm of right adrenal gland: Secondary | ICD-10-CM

## 2021-07-01 DIAGNOSIS — I700 Atherosclerosis of aorta: Secondary | ICD-10-CM | POA: Diagnosis not present

## 2021-07-01 DIAGNOSIS — E279 Disorder of adrenal gland, unspecified: Secondary | ICD-10-CM | POA: Diagnosis not present

## 2021-07-01 DIAGNOSIS — R1909 Other intra-abdominal and pelvic swelling, mass and lump: Secondary | ICD-10-CM | POA: Diagnosis not present

## 2021-09-23 DIAGNOSIS — E559 Vitamin D deficiency, unspecified: Secondary | ICD-10-CM | POA: Diagnosis not present

## 2021-09-23 DIAGNOSIS — E785 Hyperlipidemia, unspecified: Secondary | ICD-10-CM | POA: Diagnosis not present

## 2021-09-23 DIAGNOSIS — N1832 Chronic kidney disease, stage 3b: Secondary | ICD-10-CM | POA: Diagnosis not present

## 2021-09-23 DIAGNOSIS — E039 Hypothyroidism, unspecified: Secondary | ICD-10-CM | POA: Diagnosis not present

## 2021-09-23 DIAGNOSIS — I10 Essential (primary) hypertension: Secondary | ICD-10-CM | POA: Diagnosis not present

## 2021-09-23 DIAGNOSIS — R7301 Impaired fasting glucose: Secondary | ICD-10-CM | POA: Diagnosis not present

## 2021-09-23 DIAGNOSIS — Z125 Encounter for screening for malignant neoplasm of prostate: Secondary | ICD-10-CM | POA: Diagnosis not present

## 2021-09-23 DIAGNOSIS — R7989 Other specified abnormal findings of blood chemistry: Secondary | ICD-10-CM | POA: Diagnosis not present

## 2021-09-29 DIAGNOSIS — Z86010 Personal history of colonic polyps: Secondary | ICD-10-CM | POA: Diagnosis not present

## 2021-09-29 DIAGNOSIS — F17201 Nicotine dependence, unspecified, in remission: Secondary | ICD-10-CM | POA: Diagnosis not present

## 2021-09-29 DIAGNOSIS — I129 Hypertensive chronic kidney disease with stage 1 through stage 4 chronic kidney disease, or unspecified chronic kidney disease: Secondary | ICD-10-CM | POA: Diagnosis not present

## 2021-09-29 DIAGNOSIS — N1832 Chronic kidney disease, stage 3b: Secondary | ICD-10-CM | POA: Diagnosis not present

## 2021-09-29 DIAGNOSIS — Z1331 Encounter for screening for depression: Secondary | ICD-10-CM | POA: Diagnosis not present

## 2021-09-29 DIAGNOSIS — R7301 Impaired fasting glucose: Secondary | ICD-10-CM | POA: Diagnosis not present

## 2021-09-29 DIAGNOSIS — D3501 Benign neoplasm of right adrenal gland: Secondary | ICD-10-CM | POA: Diagnosis not present

## 2021-09-29 DIAGNOSIS — I700 Atherosclerosis of aorta: Secondary | ICD-10-CM | POA: Diagnosis not present

## 2021-09-29 DIAGNOSIS — E785 Hyperlipidemia, unspecified: Secondary | ICD-10-CM | POA: Diagnosis not present

## 2021-09-29 DIAGNOSIS — E039 Hypothyroidism, unspecified: Secondary | ICD-10-CM | POA: Diagnosis not present

## 2021-09-29 DIAGNOSIS — R82998 Other abnormal findings in urine: Secondary | ICD-10-CM | POA: Diagnosis not present

## 2021-09-29 DIAGNOSIS — Z1339 Encounter for screening examination for other mental health and behavioral disorders: Secondary | ICD-10-CM | POA: Diagnosis not present

## 2021-11-07 DIAGNOSIS — C61 Malignant neoplasm of prostate: Secondary | ICD-10-CM | POA: Diagnosis not present

## 2021-11-14 DIAGNOSIS — N393 Stress incontinence (female) (male): Secondary | ICD-10-CM | POA: Diagnosis not present

## 2021-11-14 DIAGNOSIS — C61 Malignant neoplasm of prostate: Secondary | ICD-10-CM | POA: Diagnosis not present

## 2021-11-14 DIAGNOSIS — N5231 Erectile dysfunction following radical prostatectomy: Secondary | ICD-10-CM | POA: Diagnosis not present

## 2021-11-24 DIAGNOSIS — Z23 Encounter for immunization: Secondary | ICD-10-CM | POA: Diagnosis not present

## 2022-03-29 DIAGNOSIS — Z961 Presence of intraocular lens: Secondary | ICD-10-CM | POA: Diagnosis not present

## 2022-05-11 DIAGNOSIS — C61 Malignant neoplasm of prostate: Secondary | ICD-10-CM | POA: Diagnosis not present

## 2022-05-18 DIAGNOSIS — N5231 Erectile dysfunction following radical prostatectomy: Secondary | ICD-10-CM | POA: Diagnosis not present

## 2022-05-18 DIAGNOSIS — N183 Chronic kidney disease, stage 3 unspecified: Secondary | ICD-10-CM | POA: Diagnosis not present

## 2022-05-18 DIAGNOSIS — C61 Malignant neoplasm of prostate: Secondary | ICD-10-CM | POA: Diagnosis not present

## 2022-06-28 ENCOUNTER — Encounter: Payer: Self-pay | Admitting: Internal Medicine

## 2022-06-28 DIAGNOSIS — D3501 Benign neoplasm of right adrenal gland: Secondary | ICD-10-CM

## 2022-10-12 DIAGNOSIS — Z125 Encounter for screening for malignant neoplasm of prostate: Secondary | ICD-10-CM | POA: Diagnosis not present

## 2022-10-12 DIAGNOSIS — I129 Hypertensive chronic kidney disease with stage 1 through stage 4 chronic kidney disease, or unspecified chronic kidney disease: Secondary | ICD-10-CM | POA: Diagnosis not present

## 2022-10-12 DIAGNOSIS — N1832 Chronic kidney disease, stage 3b: Secondary | ICD-10-CM | POA: Diagnosis not present

## 2022-10-12 DIAGNOSIS — E559 Vitamin D deficiency, unspecified: Secondary | ICD-10-CM | POA: Diagnosis not present

## 2022-10-12 DIAGNOSIS — E039 Hypothyroidism, unspecified: Secondary | ICD-10-CM | POA: Diagnosis not present

## 2022-10-12 DIAGNOSIS — R7301 Impaired fasting glucose: Secondary | ICD-10-CM | POA: Diagnosis not present

## 2022-10-12 DIAGNOSIS — E785 Hyperlipidemia, unspecified: Secondary | ICD-10-CM | POA: Diagnosis not present

## 2022-10-19 DIAGNOSIS — Z0000 Encounter for general adult medical examination without abnormal findings: Secondary | ICD-10-CM | POA: Diagnosis not present

## 2022-10-19 DIAGNOSIS — Z1331 Encounter for screening for depression: Secondary | ICD-10-CM | POA: Diagnosis not present

## 2022-10-19 DIAGNOSIS — Z8546 Personal history of malignant neoplasm of prostate: Secondary | ICD-10-CM | POA: Diagnosis not present

## 2022-10-19 DIAGNOSIS — F329 Major depressive disorder, single episode, unspecified: Secondary | ICD-10-CM | POA: Diagnosis not present

## 2022-10-19 DIAGNOSIS — K5909 Other constipation: Secondary | ICD-10-CM | POA: Diagnosis not present

## 2022-10-19 DIAGNOSIS — R82998 Other abnormal findings in urine: Secondary | ICD-10-CM | POA: Diagnosis not present

## 2022-10-19 DIAGNOSIS — Z1339 Encounter for screening examination for other mental health and behavioral disorders: Secondary | ICD-10-CM | POA: Diagnosis not present

## 2022-10-19 DIAGNOSIS — I129 Hypertensive chronic kidney disease with stage 1 through stage 4 chronic kidney disease, or unspecified chronic kidney disease: Secondary | ICD-10-CM | POA: Diagnosis not present

## 2022-10-19 DIAGNOSIS — Z23 Encounter for immunization: Secondary | ICD-10-CM | POA: Diagnosis not present

## 2022-10-19 DIAGNOSIS — R7301 Impaired fasting glucose: Secondary | ICD-10-CM | POA: Diagnosis not present

## 2022-10-19 DIAGNOSIS — E039 Hypothyroidism, unspecified: Secondary | ICD-10-CM | POA: Diagnosis not present

## 2022-10-19 DIAGNOSIS — I700 Atherosclerosis of aorta: Secondary | ICD-10-CM | POA: Diagnosis not present

## 2022-10-19 DIAGNOSIS — N1832 Chronic kidney disease, stage 3b: Secondary | ICD-10-CM | POA: Diagnosis not present

## 2022-10-19 DIAGNOSIS — F17201 Nicotine dependence, unspecified, in remission: Secondary | ICD-10-CM | POA: Diagnosis not present

## 2022-10-19 DIAGNOSIS — E785 Hyperlipidemia, unspecified: Secondary | ICD-10-CM | POA: Diagnosis not present

## 2022-11-16 DIAGNOSIS — Z23 Encounter for immunization: Secondary | ICD-10-CM | POA: Diagnosis not present

## 2022-11-21 DIAGNOSIS — H31001 Unspecified chorioretinal scars, right eye: Secondary | ICD-10-CM | POA: Diagnosis not present

## 2022-11-21 DIAGNOSIS — H43811 Vitreous degeneration, right eye: Secondary | ICD-10-CM | POA: Diagnosis not present

## 2023-04-04 DIAGNOSIS — H43811 Vitreous degeneration, right eye: Secondary | ICD-10-CM | POA: Diagnosis not present

## 2023-04-04 DIAGNOSIS — Z961 Presence of intraocular lens: Secondary | ICD-10-CM | POA: Diagnosis not present

## 2023-04-15 IMAGING — CT CT ABDOMEN W/O CM
2 of 4 series · 13 of 46 positions shown, 15 images · non-contrast
Comparison: June 09, 2020

CLINICAL DATA: Follow-up right adrenal nodule

EXAM:
CT ABDOMEN WITHOUT CONTRAST
TECHNIQUE: Multidetector CT imaging of the abdomen was performed following the
standard protocol without IV contrast.
RADIATION DOSE REDUCTION: This exam was performed according to the
departmental dose-optimization program which includes automated
exposure control, adjustment of the mA and/or kV according to
patient size and/or use of iterative reconstruction technique.

[Series 2: abd without 5.00 br40 s3 axial · axial · non-contrast · 0.68mm/px · z∈[+1351,+1606]mm · 10 of 60 slices shown, 12 images]
[im 6/60  soft-tissue]
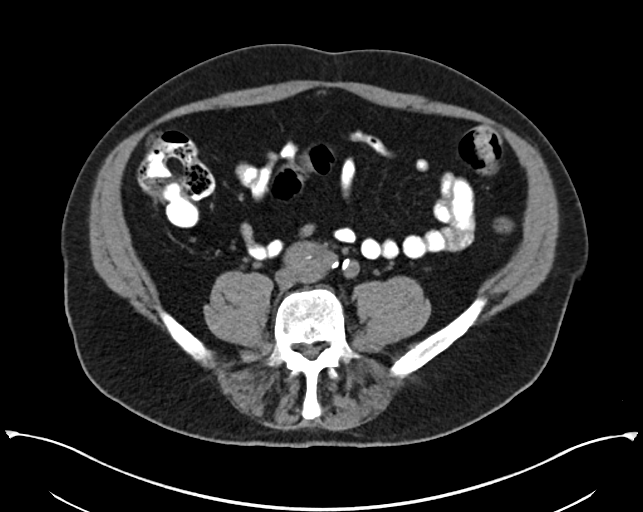
[im 6/60  bone]
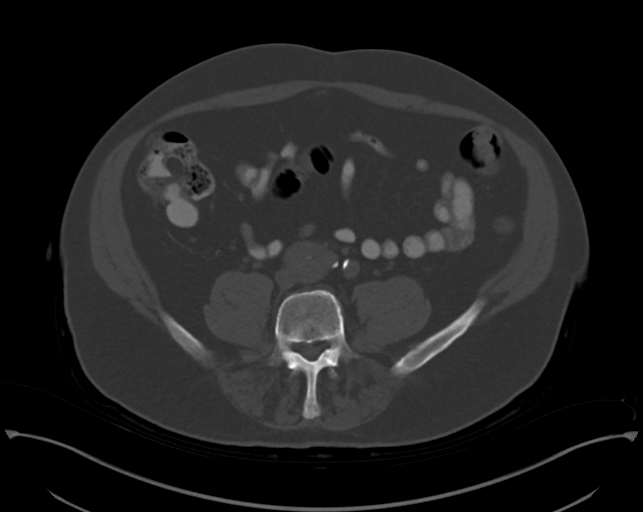
[im 12/60  soft-tissue]
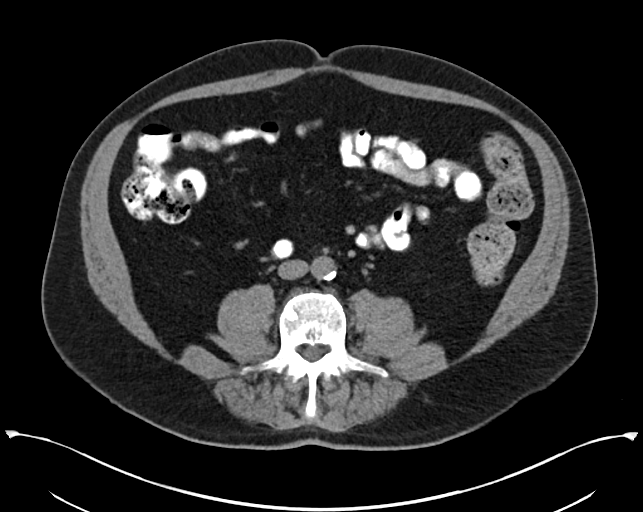
[im 17/60  soft-tissue]
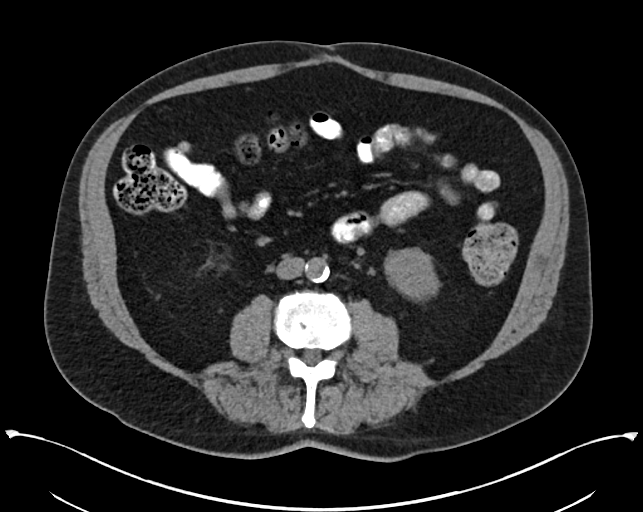
[im 23/60  soft-tissue]
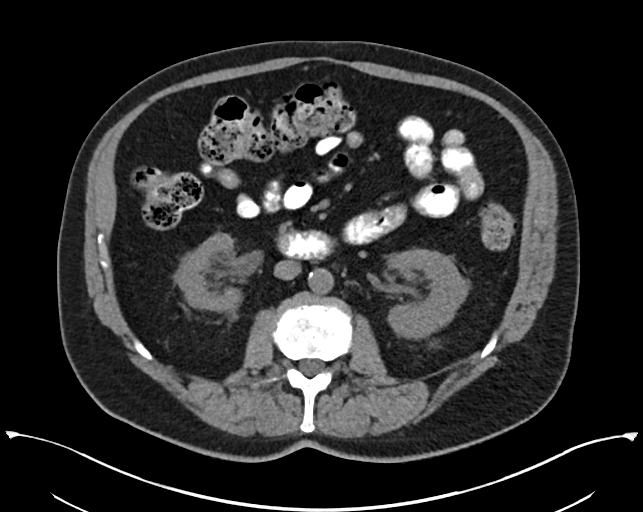
[im 29/60  soft-tissue]
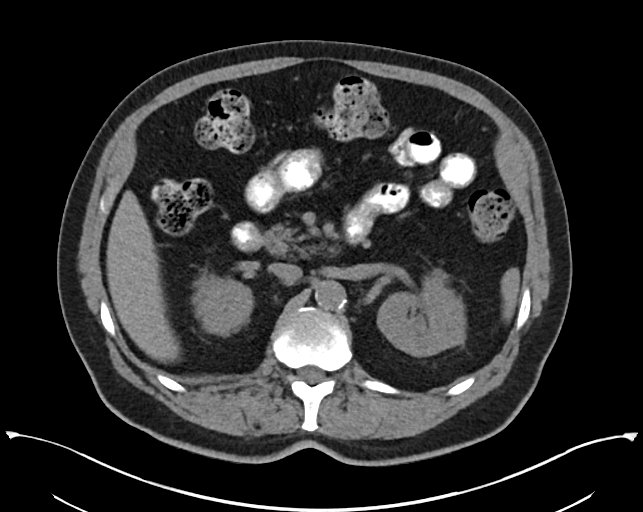
[im 34/60  soft-tissue]
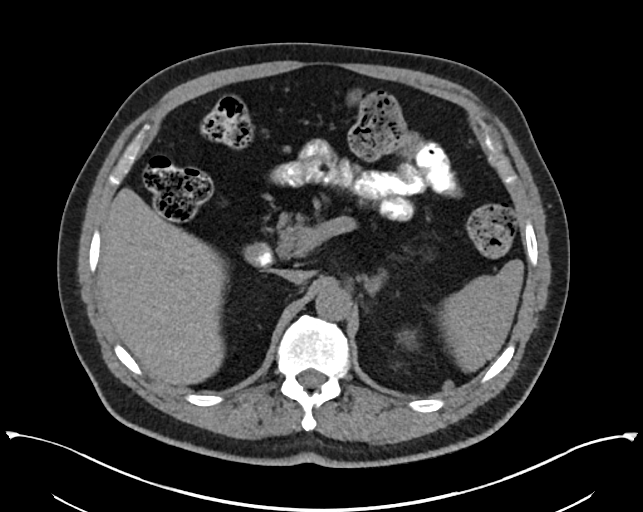
[im 40/60  soft-tissue]
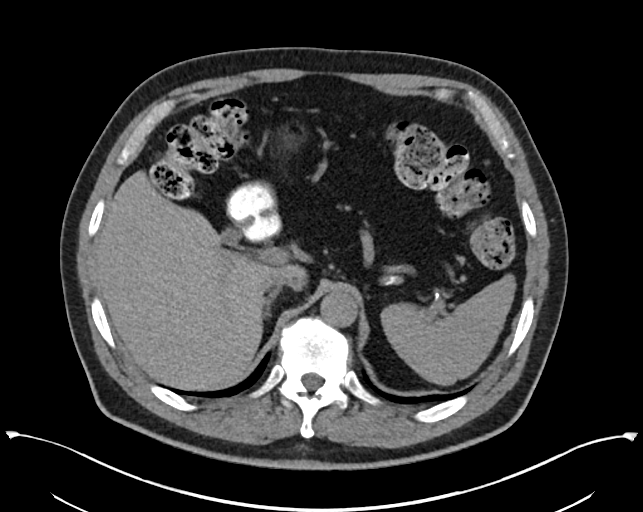
[im 45/60  soft-tissue]
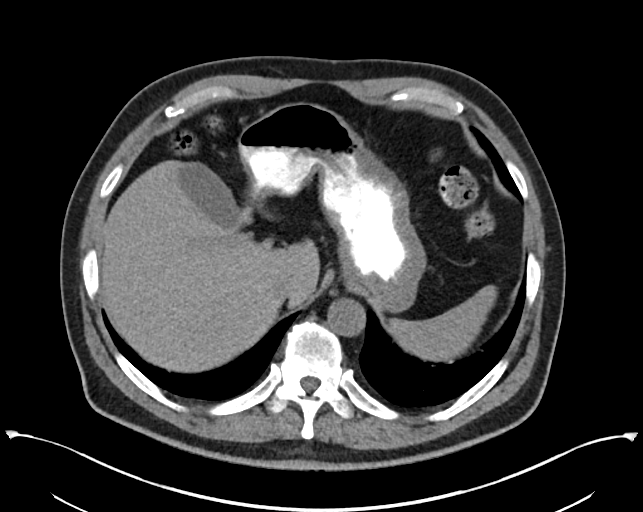
[im 51/60  soft-tissue]
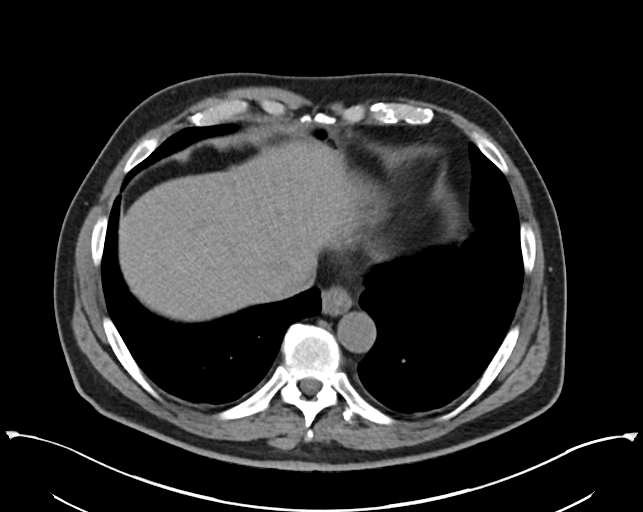
[im 51/60  bone]
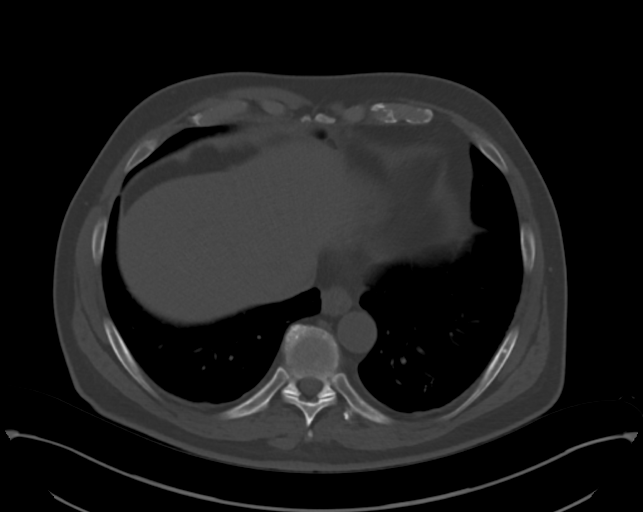
[im 57/60  soft-tissue]
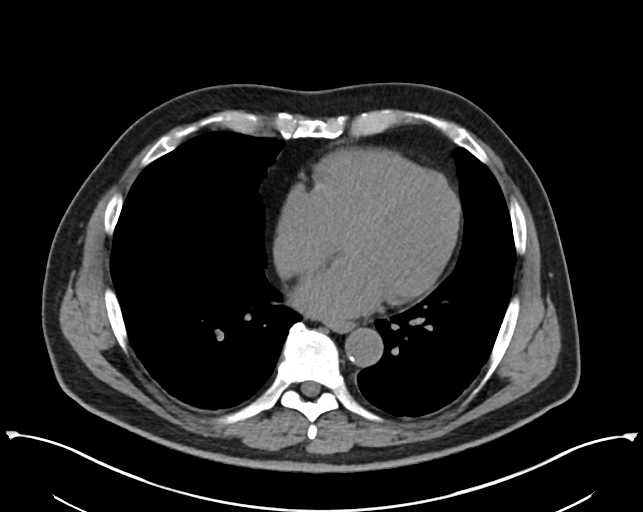

[Series 8: abd without 2.00 br40 s3 cor · coronal · non-contrast · 0.59mm/px · 3 of 167 slices shown]
[im 56/167  soft-tissue]
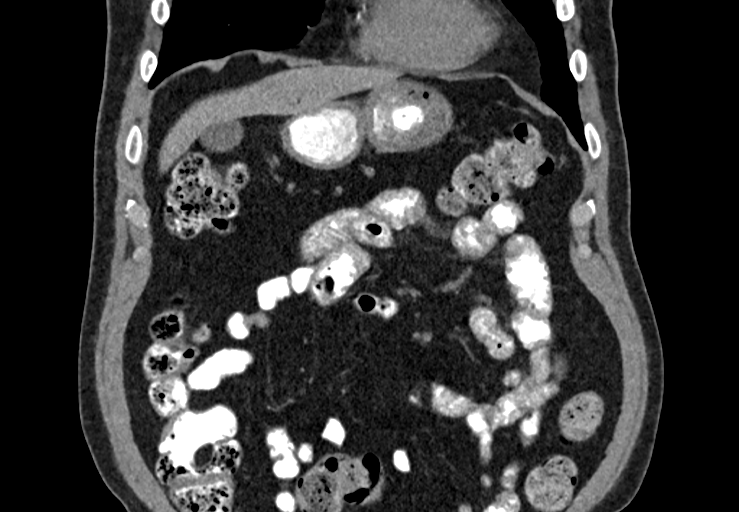
[im 74/167  soft-tissue]
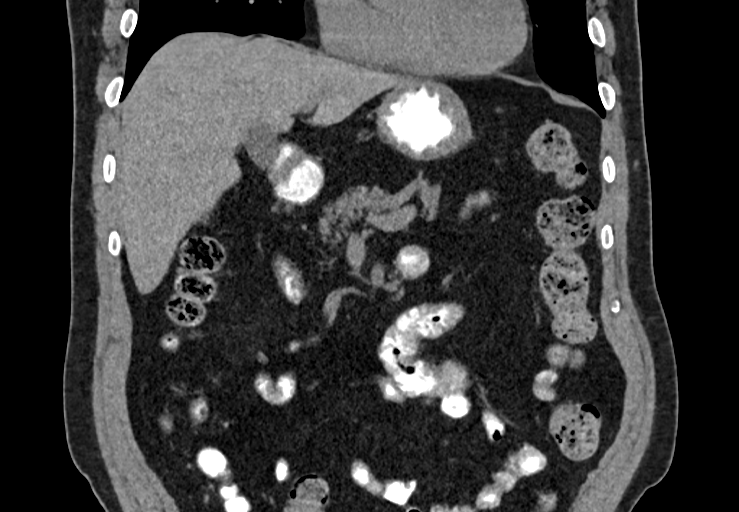
[im 93/167  soft-tissue]
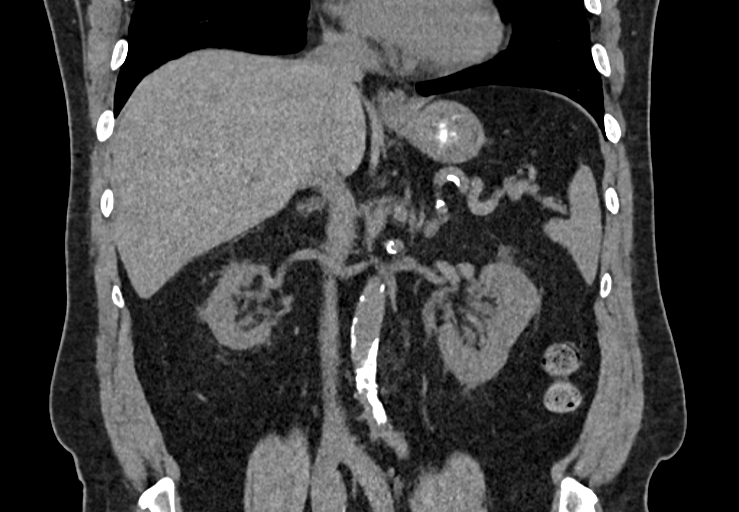

[13 of 46 positions shown; findings below may reference images not displayed]

FINDINGS: Lower chest: There is a 3 mm peripheral nodule in the left lower
lobe unchanged compared to the prior exam. Heart size is normal.

Hepatobiliary: No focal liver abnormality is seen. No gallstones,
gallbladder wall thickening, or biliary dilatation.

Pancreas: Unremarkable. No pancreatic ductal dilatation or
surrounding inflammatory changes.

Spleen: Normal in size without focal abnormality.

Adrenals/Urinary Tract: 1.6 cm right adrenal nodule with Hounsfield
unit of 32 is unchanged compared to prior exam. The left adrenal
gland is stable. Mild chronic bilateral perinephric stranding is
identified. There is no kidney stone noted. Probable left parapelvic
renal cyst is noted. No hydronephrosis is identified bilaterally.

Stomach/Bowel: Stomach is within normal limits. Appendix is not
included in the field of view. No evidence of bowel wall thickening,
distention, or inflammatory changes.

Vascular/Lymphatic: Aortic atherosclerosis. No enlarged abdominal
lymph nodes.

Other: None.

Musculoskeletal: Degenerative joint changes of the spine are noted.
IMPRESSION: 1. Stable 1.6 cm right adrenal nodule with Hounsfield unit of 32.
Consider follow-up imaging in 12 months with adrenal protocol CT.
This recommendation follows ACR consensus guidelines: Management of
incidental adrenal Masses: A white paper of the ACR incidental
findings committee.
2. 3 mm peripheral nodule in the left lower lobe stable compared to
prior exam. No follow-up needed if patient is low-risk.This
recommendation follows the consensus statement: Guidelines for
Management of Incidental Pulmonary Nodules Detected on CT Images:
3. Aortic atherosclerosis.

Aortic Atherosclerosis (06AP9-HO3.3).

## 2023-05-15 DIAGNOSIS — C61 Malignant neoplasm of prostate: Secondary | ICD-10-CM | POA: Diagnosis not present

## 2023-05-22 DIAGNOSIS — N393 Stress incontinence (female) (male): Secondary | ICD-10-CM | POA: Diagnosis not present

## 2023-05-22 DIAGNOSIS — C61 Malignant neoplasm of prostate: Secondary | ICD-10-CM | POA: Diagnosis not present

## 2023-05-22 DIAGNOSIS — N5231 Erectile dysfunction following radical prostatectomy: Secondary | ICD-10-CM | POA: Diagnosis not present

## 2023-09-20 DIAGNOSIS — H43813 Vitreous degeneration, bilateral: Secondary | ICD-10-CM | POA: Diagnosis not present

## 2023-11-08 DIAGNOSIS — E559 Vitamin D deficiency, unspecified: Secondary | ICD-10-CM | POA: Diagnosis not present

## 2023-11-08 DIAGNOSIS — E785 Hyperlipidemia, unspecified: Secondary | ICD-10-CM | POA: Diagnosis not present

## 2023-11-08 DIAGNOSIS — R7301 Impaired fasting glucose: Secondary | ICD-10-CM | POA: Diagnosis not present

## 2023-11-08 DIAGNOSIS — Z0189 Encounter for other specified special examinations: Secondary | ICD-10-CM | POA: Diagnosis not present

## 2023-11-08 DIAGNOSIS — E039 Hypothyroidism, unspecified: Secondary | ICD-10-CM | POA: Diagnosis not present

## 2023-11-08 DIAGNOSIS — Z125 Encounter for screening for malignant neoplasm of prostate: Secondary | ICD-10-CM | POA: Diagnosis not present

## 2023-11-08 DIAGNOSIS — N1832 Chronic kidney disease, stage 3b: Secondary | ICD-10-CM | POA: Diagnosis not present

## 2023-11-08 DIAGNOSIS — I129 Hypertensive chronic kidney disease with stage 1 through stage 4 chronic kidney disease, or unspecified chronic kidney disease: Secondary | ICD-10-CM | POA: Diagnosis not present

## 2023-11-15 DIAGNOSIS — Z860101 Personal history of adenomatous and serrated colon polyps: Secondary | ICD-10-CM | POA: Diagnosis not present

## 2023-11-15 DIAGNOSIS — I700 Atherosclerosis of aorta: Secondary | ICD-10-CM | POA: Diagnosis not present

## 2023-11-15 DIAGNOSIS — D3501 Benign neoplasm of right adrenal gland: Secondary | ICD-10-CM | POA: Diagnosis not present

## 2023-11-15 DIAGNOSIS — E559 Vitamin D deficiency, unspecified: Secondary | ICD-10-CM | POA: Diagnosis not present

## 2023-11-15 DIAGNOSIS — E785 Hyperlipidemia, unspecified: Secondary | ICD-10-CM | POA: Diagnosis not present

## 2023-11-15 DIAGNOSIS — I129 Hypertensive chronic kidney disease with stage 1 through stage 4 chronic kidney disease, or unspecified chronic kidney disease: Secondary | ICD-10-CM | POA: Diagnosis not present

## 2023-11-15 DIAGNOSIS — Z1339 Encounter for screening examination for other mental health and behavioral disorders: Secondary | ICD-10-CM | POA: Diagnosis not present

## 2023-11-15 DIAGNOSIS — F17201 Nicotine dependence, unspecified, in remission: Secondary | ICD-10-CM | POA: Diagnosis not present

## 2023-11-15 DIAGNOSIS — R7301 Impaired fasting glucose: Secondary | ICD-10-CM | POA: Diagnosis not present

## 2023-11-15 DIAGNOSIS — I10 Essential (primary) hypertension: Secondary | ICD-10-CM | POA: Diagnosis not present

## 2023-11-15 DIAGNOSIS — Z1331 Encounter for screening for depression: Secondary | ICD-10-CM | POA: Diagnosis not present

## 2023-11-15 DIAGNOSIS — E039 Hypothyroidism, unspecified: Secondary | ICD-10-CM | POA: Diagnosis not present

## 2023-11-15 DIAGNOSIS — N1832 Chronic kidney disease, stage 3b: Secondary | ICD-10-CM | POA: Diagnosis not present

## 2023-11-15 DIAGNOSIS — R82998 Other abnormal findings in urine: Secondary | ICD-10-CM | POA: Diagnosis not present

## 2023-11-15 DIAGNOSIS — F329 Major depressive disorder, single episode, unspecified: Secondary | ICD-10-CM | POA: Diagnosis not present

## 2023-11-15 DIAGNOSIS — Z8546 Personal history of malignant neoplasm of prostate: Secondary | ICD-10-CM | POA: Diagnosis not present

## 2023-11-15 DIAGNOSIS — Z0000 Encounter for general adult medical examination without abnormal findings: Secondary | ICD-10-CM | POA: Diagnosis not present

## 2023-11-17 DIAGNOSIS — Z23 Encounter for immunization: Secondary | ICD-10-CM | POA: Diagnosis not present
